# Patient Record
Sex: Male | Born: 1960
Health system: Southern US, Community
[De-identification: ages and names within clinical notes are randomized; demographics above are authoritative.]

## PROBLEM LIST (undated history)

## (undated) DIAGNOSIS — F32A Depression, unspecified: Secondary | ICD-10-CM

## (undated) DIAGNOSIS — F329 Major depressive disorder, single episode, unspecified: Secondary | ICD-10-CM

## (undated) DIAGNOSIS — F419 Anxiety disorder, unspecified: Secondary | ICD-10-CM

## (undated) HISTORY — DX: Anxiety disorder, unspecified: F41.9

## (undated) HISTORY — DX: Major depressive disorder, single episode, unspecified: F32.9

## (undated) HISTORY — PX: FOOT SURGERY: SHX648

## (undated) HISTORY — DX: Depression, unspecified: F32.A

---

## 2004-08-07 ENCOUNTER — Ambulatory Visit: Payer: Self-pay | Admitting: Internal Medicine

## 2005-04-10 ENCOUNTER — Emergency Department: Payer: Self-pay | Admitting: Emergency Medicine

## 2005-04-18 ENCOUNTER — Ambulatory Visit: Payer: Self-pay

## 2005-04-28 ENCOUNTER — Ambulatory Visit: Payer: Self-pay | Admitting: Internal Medicine

## 2006-08-08 IMAGING — CT CT CHEST W/ CM
1 series · 15 of 33 positions shown, 19 images · non-contrast
Comparison: none

REASON FOR EXAM: bronchitis and cough
COMMENTS:

[Series 2: soft tissue · axial · 0.72mm/px · z∈[-680,-406]mm · 15 of 65 slices shown, 19 images]
[im 5/65  mediastinal]
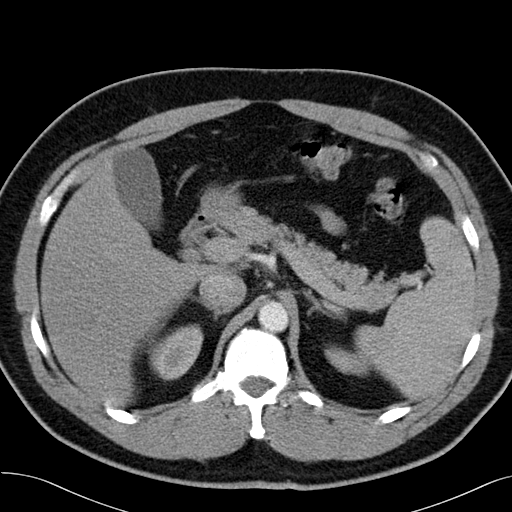
[im 5/65  lung]
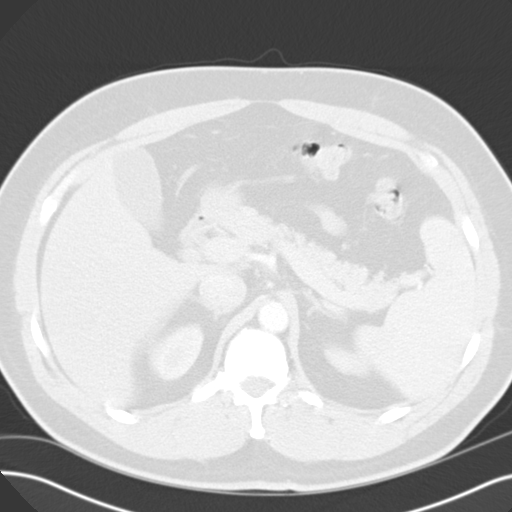
[im 10/65  lung]
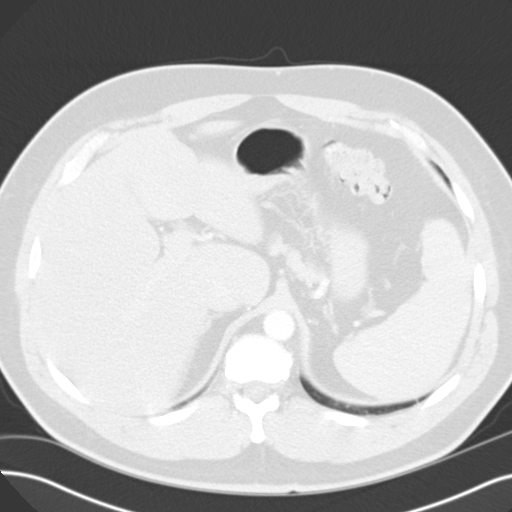
[im 13/65  lung]
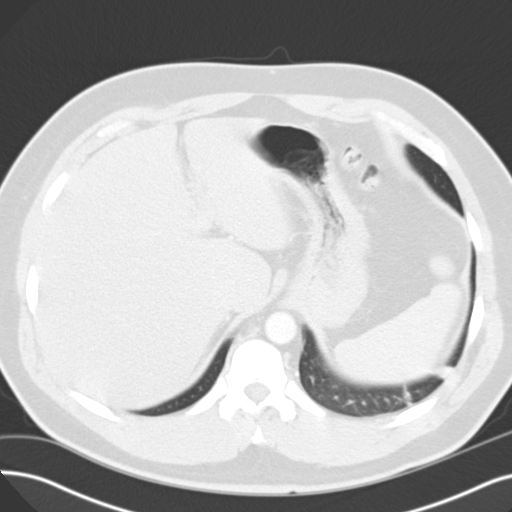
[im 17/65  lung]
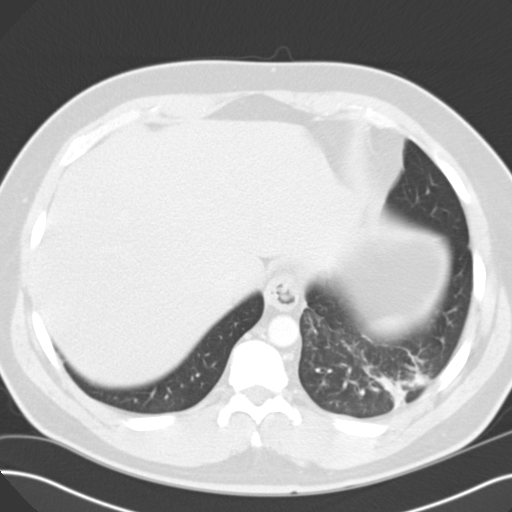
[im 22/65  mediastinal]
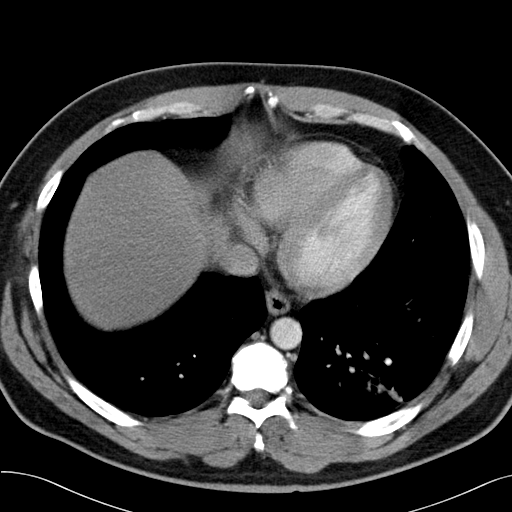
[im 22/65  lung]
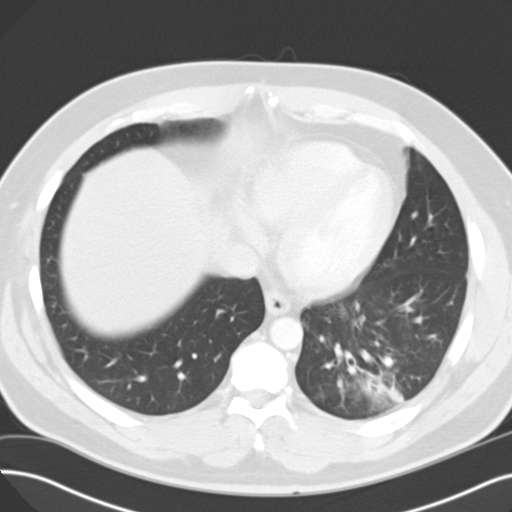
[im 26/65  lung]
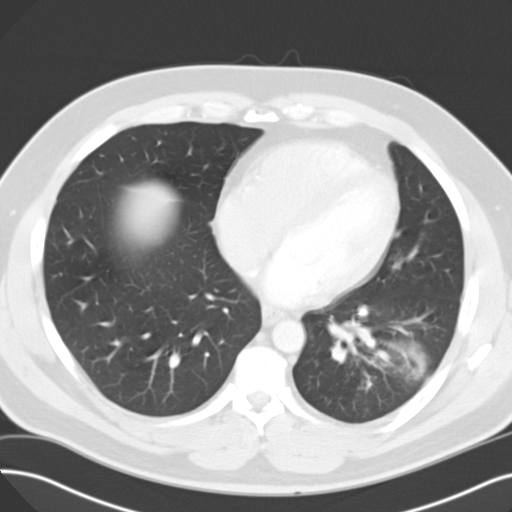
[im 29/65  lung]
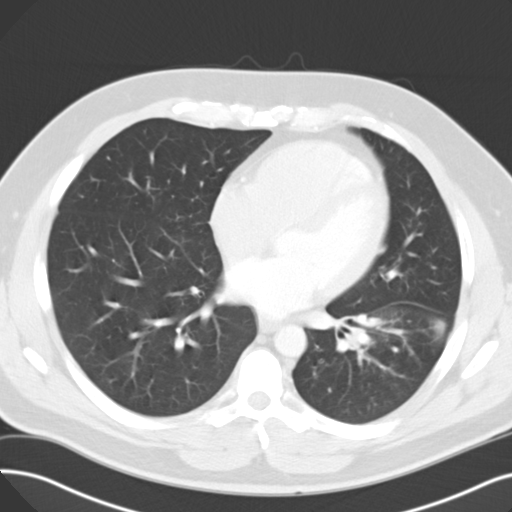
[im 34/65  lung]
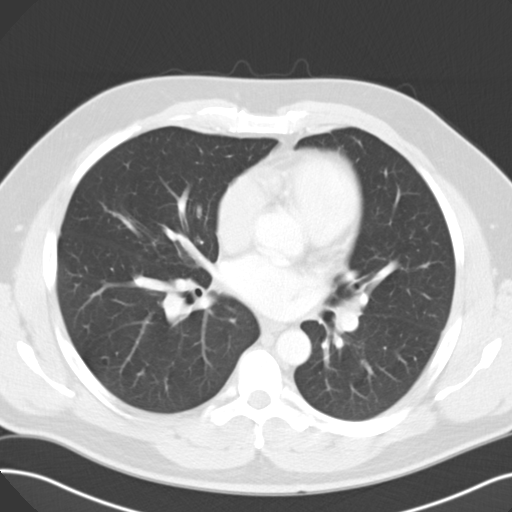
[im 36/65  mediastinal]
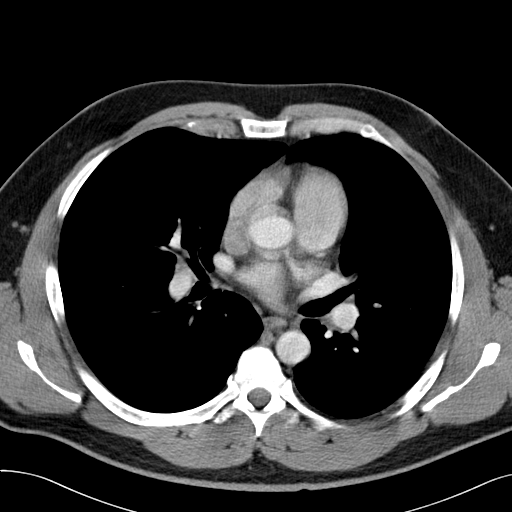
[im 36/65  lung]
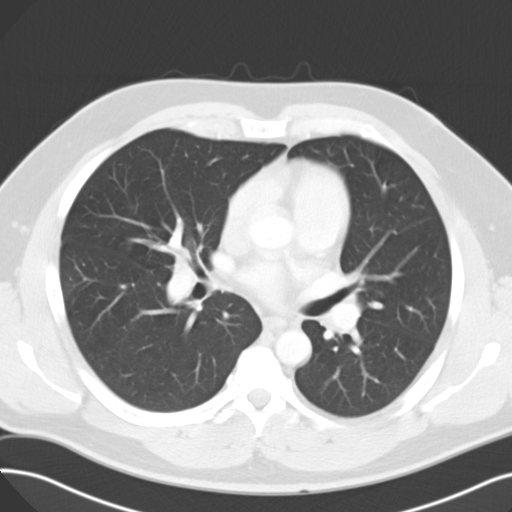
[im 39/65  lung]
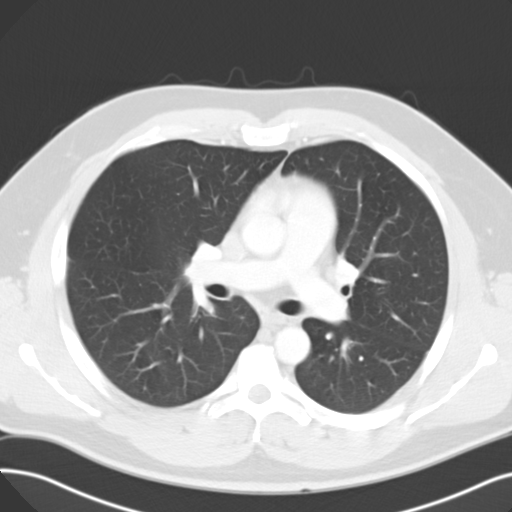
[im 43/65  lung]
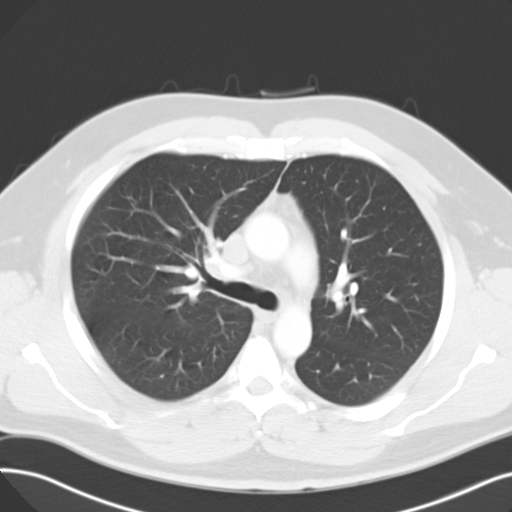
[im 48/65  lung]
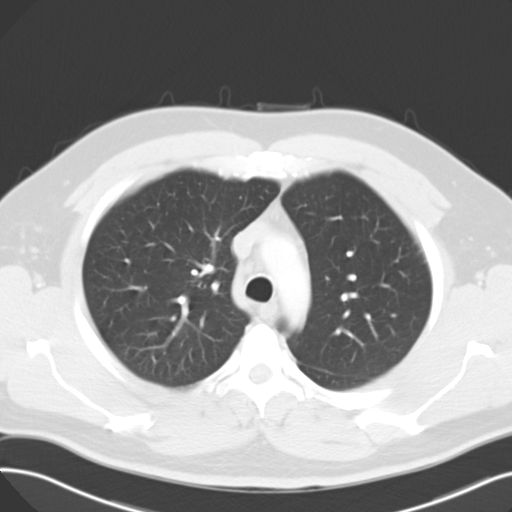
[im 52/65  mediastinal]
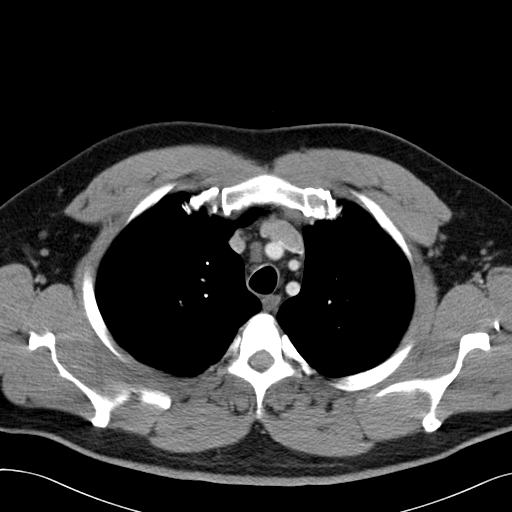
[im 52/65  lung]
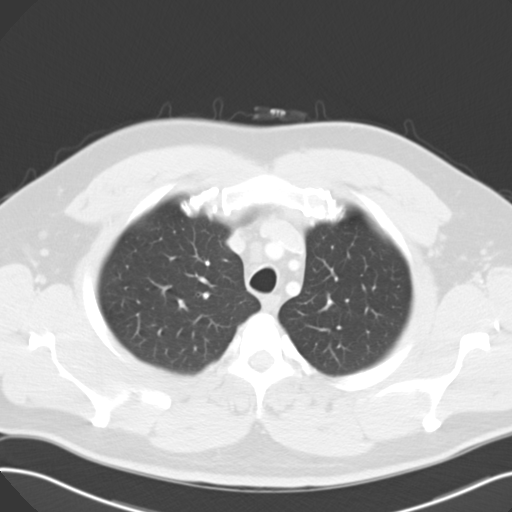
[im 55/65  lung]
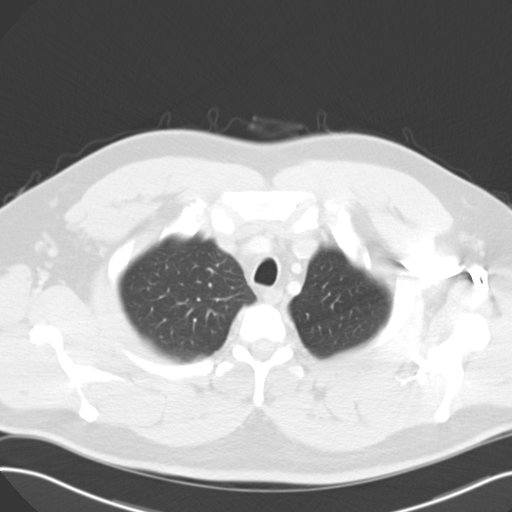
[im 60/65  lung]
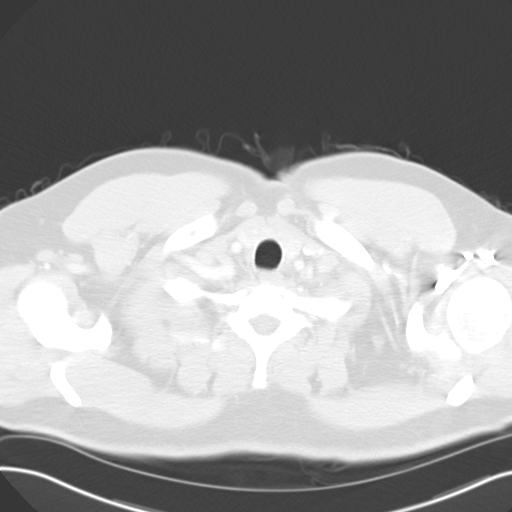

[15 of 33 positions shown; findings below may reference images not displayed]

PROCEDURE:     CT  - CT CHEST WITH CONTRAST  - April 18, 2005  [DATE]

RESULT:     The patient has clinical findings of bronchitis with cough.  The
patient received 75 ccs of Isovue 370 for this study.

Within the mediastinum and hilar regions I see no pathologic sized lymph
nodes.  The caliber of the thoracic aorta is normal. The cardiac chambers
are not enlarged. There is no pleural nor pericardial effusion identified.
There is a small hiatal hernia. At lung window settings the upper lobes and
RIGHT middle lobe are clear. In the LEFT lower lobe there is patchy density
extending from the pleural surface centrally consistent with atelectasis.  I
see no discrete air bronchogram.  The RIGHT lower lobe is clear.  Neither
lung demonstrates abnormal masses. Within the upper abdomen the observed
portions of the liver are normal. The adrenal gland and partially imaged
spleen appeared normal.
IMPRESSION: 1)There are findings consistent with atelectasis or infiltrate in the LEFT
lower lobe. I see no infiltrates elsewhere and no evidence of central
obstructing lesions.

2)I see no evidence of CHF nor other acute cardiopulmonary disease.

## 2007-02-01 ENCOUNTER — Emergency Department (HOSPITAL_COMMUNITY): Admission: EM | Admit: 2007-02-01 | Discharge: 2007-02-01 | Payer: Self-pay | Admitting: Emergency Medicine

## 2007-08-16 ENCOUNTER — Emergency Department (HOSPITAL_COMMUNITY): Admission: EM | Admit: 2007-08-16 | Discharge: 2007-08-16 | Payer: Self-pay | Admitting: Emergency Medicine

## 2008-10-10 ENCOUNTER — Emergency Department (HOSPITAL_COMMUNITY): Admission: EM | Admit: 2008-10-10 | Discharge: 2008-10-10 | Payer: Self-pay | Admitting: Emergency Medicine

## 2008-10-12 ENCOUNTER — Emergency Department (HOSPITAL_COMMUNITY): Admission: EM | Admit: 2008-10-12 | Discharge: 2008-10-12 | Payer: Self-pay | Admitting: Family Medicine

## 2010-11-23 NOTE — Consult Note (Signed)
[name], [name]                 ACCOUNT NO.:  [id number]   MEDICAL RECORD NO.:  [id number]          PATIENT TYPE:  EMS   LOCATION:  [facility]                         FACILITY:  [facility]   PHYSICIAN:  Corinna L. Lendell Caprice, MDDATE OF BIRTH:  1961-07-03   DATE OF CONSULTATION:  08/16/2007  DATE OF DISCHARGE:                                 CONSULTATION   CONSULTING PHYSICIAN:  Donnetta Hutching, M.D.   REASON FOR CONSULTATION:  Chest pain.   IMPRESSION/RECOMMENDATIONS:  Chest pain.  This is very atypical.  His  point-of-care enzymes, EKG, chest x-ray and D-dimer are all within  normal limits.  The nitroglycerin did not change the pain.  The patient  has no cardiac risk factors and is feeling better.  I do not feel he  needs inpatient workup.  I have, however, discussed the case with Leroy Salazar from [facility], and I will have him follow up with Dr. Donato Salazar as an outpatient to consider stress Cardiolite.  In the meantime,  I have asked him to take an aspirin 81 mg daily.   HISTORY OF PRESENT ILLNESS:  [name] is a pleasant 50 year old white  male patient of Dr. Abigail Salazar who came in with several hours' worth of  chest discomfort.  He describes it as bandlike across his upper abdomen  and lower chest.  He thought initially it was indigestion.  He has never  had this sensation before.  It was at rest.  He has never had a cardiac  workup.  He has no chronic medical problems.  The pain is gone now.  He  had no accompanying symptoms.  No exacerbating factors.   PAST MEDICAL HISTORY:  None.   MEDICATIONS:  None.   ALLERGIES:  None.   SOCIAL HISTORY:  He drinks occasionally.  He does not smoke.  No drugs.  He is a Insurance underwriter.   FAMILY HISTORY:  Negative for early coronary artery disease.   REVIEW OF SYSTEMS:  As above, otherwise negative.   PHYSICAL EXAMINATION:  VITAL SIGNS:  He has normal vital signs.  Please  see nursing notes for details.  GENERAL:  He is well-nourished,  well-developed, in no acute distress.  HEENT:  Normocephalic, atraumatic.  Pupils are equal, round and reactive  to light.  Sclerae are anicteric.  Moist mucous membranes.  NECK:  Supple.  No lymphadenopathy.  No carotid bruits.  No JVD.  LUNGS:  Clear to auscultation bilaterally without wheezes, rhonchi or  rales.  CARDIOVASCULAR:  Regular rate and rhythm without murmurs, gallops or  rubs.  No chest wall tenderness.  ABDOMEN:  Soft, nontender, nondistended.  GENITOURINARY/RECTAL:  Deferred.  EXTREMITIES:  No clubbing, cyanosis or edema.  SKIN:  No rash.  PSYCHIATRIC:  Normal affect.   LABORATORY DATA:  Hemoglobin is 7.3, hematocrit 51, D-dimer less than  0.22.  Basic metabolic panel normal.  Point-of-care enzymes normal.  EKG  shows sinus bradycardia with a rate of 58.  Chest x-ray negative.      Corinna L. Lendell Caprice, MD  Electronically Signed     CLS/MEDQ  D:  08/16/2007  T:  08/17/2007  Job:  [id number]   cc:   Leroy Salazar. Leroy Salazar, M.D.  Leroy Bathe, MD

## 2011-04-01 LAB — I-STAT 8, (EC8 V) (CONVERTED LAB)
Acid-base deficit: 1
Bicarbonate: 24.9 — ABNORMAL HIGH
HCT: 51
Hemoglobin: 17.3 — ABNORMAL HIGH
Operator id: 294501
Potassium: 4
Sodium: 139
TCO2: 26

## 2011-04-01 LAB — POCT CARDIAC MARKERS
CKMB, poc: <1 — ABNORMAL LOW
Operator id: 294501
Troponin i, poc: <0.05

## 2011-04-01 LAB — POCT I-STAT CREATININE
Creatinine, Ser: 1
Operator id: 294501

## 2011-10-21 ENCOUNTER — Encounter: Payer: Self-pay | Admitting: Family Medicine

## 2011-10-21 ENCOUNTER — Ambulatory Visit (INDEPENDENT_AMBULATORY_CARE_PROVIDER_SITE_OTHER): Payer: BC Managed Care – PPO | Admitting: Family Medicine

## 2011-10-21 VITALS — BP 127/84 | HR 65 | Temp 98.3°F | Ht 75.0 in | Wt 260.0 lb

## 2011-10-21 DIAGNOSIS — M7660 Achilles tendinitis, unspecified leg: Secondary | ICD-10-CM

## 2011-10-21 DIAGNOSIS — S8990XA Unspecified injury of unspecified lower leg, initial encounter: Secondary | ICD-10-CM

## 2011-10-21 DIAGNOSIS — S99921A Unspecified injury of right foot, initial encounter: Secondary | ICD-10-CM

## 2011-10-21 NOTE — Patient Instructions (Signed)
You partially tore/strained your right plantar fascia. You also have right achilles tendinopathy. Take tylenol and/or aleve as needed for pain  Plantar fascia stretch for 20-30 seconds (do 3 of these) in morning Lowering/raise on a step exercises 3 x 10 once or twice a day - this is very important for long term recovery - may want to way 5-7 days to start this on your right side. Some people will opt for a walking boot with heel lifts - this is a consideration but most people improve faster without it. Can add heel walks, toe walks forward and backward as well Ice heel for 15 minutes as needed. Avoid flat shoes/barefoot walking as much as possible. Arch straps have been shown to help with pain. Heel lifts may help with pain by avoiding fully stretching the plantar fascia except when doing home exercises. Orthotics with heel lift may be helpful. Steroid injection is a consideration for short term pain relief if you are struggling. Physical therapy is also an option. Can consider nitro patches for left achilles tendon if not improving. Follow up with me in 1 month for a recheck.

## 2011-10-25 ENCOUNTER — Encounter: Payer: Self-pay | Admitting: Family Medicine

## 2011-10-25 DIAGNOSIS — S99921A Unspecified injury of right foot, initial encounter: Secondary | ICD-10-CM | POA: Insufficient documentation

## 2011-10-25 DIAGNOSIS — M7660 Achilles tendinitis, unspecified leg: Secondary | ICD-10-CM | POA: Insufficient documentation

## 2011-10-25 NOTE — Progress Notes (Signed)
Subjective:    Patient ID: Leroy Salazar, male    DOB: 1961-01-31, 51 y.o.   MRN: [id number]  PCP: [facility] [employer]  HPI 51 yo M here for bilateral heel pain.  Patient reports having had prior issues with right sided plantar fasciitis - previously had injections x 2 to right heel. States was playing basketball on 4/11 - felt at time that he had a stone in his shoe on plantar surface (had this for about 3 weeks). Then while running felt a pop on plantar surface of heel that was painful and caused him to stop playing. Iced area last night, taking ibuprofen. Has been resting and doing basic stretches was shown previously for plantar fascia. Limping as a result of this injury. Had prior surgeries of right ankle to repair peroneal subluxation and an osteotomy medially based on his reports. Has also tried orthotics previously. Notes having achilles pain on left side as well.  History reviewed. No pertinent past medical history.  No current outpatient prescriptions on file prior to visit.    Past Surgical History  Procedure Date  . Foot surgery     Allergies  Allergen Reactions  . Codeine     History   Social History  . Marital Status: Divorced    Spouse Name: N/A    Number of Children: N/A  . Years of Education: N/A   Occupational History  . Not on file.   Social History Main Topics  . Smoking status: Never Smoker   . Smokeless tobacco: Not on file  . Alcohol Use: Not on file  . Drug Use: Not on file  . Sexually Active: Not on file   Other Topics Concern  . Not on file   Social History Narrative  . No narrative on file    Family History  Problem Relation Age of Onset  . Sudden death Neg Hx   . Hyperlipidemia Neg Hx   . Heart attack Neg Hx   . Diabetes Neg Hx   . Hypertension Neg Hx     BP 127/84  Pulse 65  Temp(Src) 98.3 F (36.8 C) (Oral)  Ht 6\' 3"  (1.905 m)  Wt 260 lb (117.935 kg)  BMI 32.50 kg/m2  Review of Systems See HPI above.      Objective:   Physical Exam Gen: NAD  R foot/ankle: No gross deformity, swelling, ecchymoses FROM ankle with pain in prox plantar fascia with dorsiflexion. TTP proximal plantar fascia especially at medial anterior portion of calcaneus. Negative ant drawer and talar tilt.   Negative syndesmotic compression. Thompsons test negative. NV intact distally.  L foot/ankle: No gross deformity, swelling, ecchymoses. FROM TTP 2 cm proximal to achilles insertion on calcaneus. Negative ant drawer and talar tilt.   Negative syndesmotic compression. Thompsons test negative. NV intact distally.  MSK u/s: increased fluid within proximal left plantar fascia - measures about 0.78cm at maximal thickness.    Assessment & Plan:  1. Right foot injury - due to plantar fascia strain/partial rupture.  Reassured patient.  Advised to wait about 5-7 days then start HEP including eccentric exercises.  Tylenol/aleve as needed for pain.  Considered arch strap but not much benefit felt with this in the office.  Heel lifts to help unload area.  Avoid flat shoes, barefoot walking, hills as much as possible.  Consider steroid injection (usually avoid this with strain/partial tear though), orthotics, PT if not improving as expected.  2. Left achilles tendinopathy - Similar exercises demonstrated, heel lifts,  icing, tylenol/nsaids as needed.  Consider orthotics, formal PT, nitro patches if not improving as expected.

## 2011-10-25 NOTE — Assessment & Plan Note (Signed)
Similar exercises demonstrated, heel lifts, icing, tylenol/nsaids as needed.  Consider orthotics, formal PT, nitro patches if not improving as expected.

## 2011-10-25 NOTE — Assessment & Plan Note (Signed)
due to plantar fascia strain/partial rupture.  Reassured patient.  Advised to wait about 5-7 days then start HEP including eccentric exercises.  Tylenol/aleve as needed for pain.  Considered arch strap but not much benefit felt with this in the office.  Heel lifts to help unload area.  Avoid flat shoes, barefoot walking, hills as much as possible.  Consider steroid injection (usually avoid this with strain/partial tear though), orthotics, PT if not improving as expected.

## 2013-04-12 LAB — LIPID PANEL
CHOLESTEROL: 169 mg/dL (ref 0–200)
HDL: 43 mg/dL (ref 35–70)
LDL CALC: 104 mg/dL
TRIGLYCERIDES: 111 mg/dL (ref 40–160)

## 2013-04-12 LAB — BASIC METABOLIC PANEL
BUN: 17 mg/dL (ref 4–21)
Creatinine: 0.9 mg/dL (ref <=1.3)
Glucose: 83 mg/dL
Potassium: 4.4 mmol/L (ref 3.4–5.3)
Sodium: 138 mmol/L (ref 137–147)

## 2013-04-12 LAB — HEPATIC FUNCTION PANEL
ALK PHOS: 39 U/L (ref 25–125)
ALT: 32 U/L (ref 10–40)
AST: 27 U/L (ref 14–40)
Bilirubin, Total: 0.7 mg/dL

## 2013-04-12 LAB — CBC AND DIFFERENTIAL
HCT: 48 % (ref 41–53)
HEMOGLOBIN: 16.6 g/dL (ref 13.5–17.5)
PLATELETS: 218 10*3/uL (ref 150–399)
WBC: 6.6 10^3/mL

## 2013-04-12 LAB — CHLORIDE: Chloride: 107 mmol/L

## 2013-04-12 LAB — ESTIMATED GFR: EGFR (Non-African Amer.): 94

## 2013-04-12 LAB — PSA: PSA, Total: 0.78

## 2013-05-28 LAB — HM COLONOSCOPY

## 2013-05-29 ENCOUNTER — Ambulatory Visit: Payer: BC Managed Care – PPO | Admitting: Family Medicine

## 2013-06-12 ENCOUNTER — Ambulatory Visit: Payer: BC Managed Care – PPO | Admitting: Family Medicine

## 2016-02-12 ENCOUNTER — Encounter: Payer: Self-pay | Admitting: Family Medicine

## 2016-02-12 ENCOUNTER — Ambulatory Visit (INDEPENDENT_AMBULATORY_CARE_PROVIDER_SITE_OTHER): Payer: BLUE CROSS/BLUE SHIELD | Admitting: Family Medicine

## 2016-02-12 VITALS — BP 129/84 | HR 56 | Ht 73.5 in | Wt 271.8 lb

## 2016-02-12 DIAGNOSIS — R030 Elevated blood-pressure reading, without diagnosis of hypertension: Secondary | ICD-10-CM | POA: Insufficient documentation

## 2016-02-12 DIAGNOSIS — E669 Obesity, unspecified: Secondary | ICD-10-CM

## 2016-02-12 DIAGNOSIS — F430 Acute stress reaction: Secondary | ICD-10-CM | POA: Diagnosis not present

## 2016-02-12 DIAGNOSIS — F4323 Adjustment disorder with mixed anxiety and depressed mood: Secondary | ICD-10-CM | POA: Diagnosis not present

## 2016-02-12 DIAGNOSIS — IMO0001 Reserved for inherently not codable concepts without codable children: Secondary | ICD-10-CM

## 2016-02-12 MED ORDER — FLUOXETINE HCL 20 MG PO TABS: 20.0000 mg | ORAL_TABLET | Freq: Every day | ORAL | 0 refills | Status: DC

## 2016-02-12 NOTE — Assessment & Plan Note (Signed)
No suicidal or homicidal ideations. Says he would never do that to his kids

## 2016-02-12 NOTE — Assessment & Plan Note (Signed)
Risk benefits fluoxetine discussed with patient.  Dietary and lifestyle modifications for better management of stress discussed- exercise etc  Sleep hygiene

## 2016-02-12 NOTE — Patient Instructions (Signed)
Hypertension Hypertension, commonly called high blood pressure, is when the force of blood pumping through your arteries is too strong. Your arteries are the blood vessels that carry blood from your heart throughout your body. A blood pressure reading consists of a higher number over a lower number, such as 110/72. The higher number (systolic) is the pressure inside your arteries when your heart pumps. The lower number (diastolic) is the pressure inside your arteries when your heart relaxes. Ideally you want your blood pressure below 120/80. Hypertension forces your heart to work harder to pump blood. Your arteries may become narrow or stiff. Having untreated or uncontrolled hypertension can cause heart attack, stroke, kidney disease, and other problems. RISK FACTORS Some risk factors for high blood pressure are controllable. Others are not.  Risk factors you cannot control include:   Race. You may be at higher risk if you are African American.  Age. Risk increases with age.  Gender. Men are at higher risk than women before age 61 years. After age 68, women are at higher risk than men. Risk factors you can control include:  Not getting enough exercise or physical activity.  Being overweight.  Getting too much fat, sugar, calories, or salt in your diet.  Drinking too much alcohol. SIGNS AND SYMPTOMS Hypertension does not usually cause signs or symptoms. Extremely high blood pressure (hypertensive crisis) may cause headache, anxiety, shortness of breath, and nosebleed. DIAGNOSIS To check if you have hypertension, your health care provider will measure your blood pressure while you are seated, with your arm held at the level of your heart. It should be measured at least twice using the same arm. Certain conditions can cause a difference in blood pressure between your right and left arms. A blood pressure reading that is higher than normal on one occasion does not mean that you need  treatment. If it is not clear whether you have high blood pressure, you may be asked to return on a different day to have your blood pressure checked again. Or, you may be asked to monitor your blood pressure at home for 1 or more weeks. TREATMENT Treating high blood pressure includes making lifestyle changes and possibly taking medicine. Living a healthy lifestyle can help lower high blood pressure. You may need to change some of your habits. Lifestyle changes may include:  Following the DASH diet. This diet is high in fruits, vegetables, and whole grains. It is low in salt, red meat, and added sugars.  Keep your sodium intake below 2,300 mg per day.  Getting at least 30-45 minutes of aerobic exercise at least 4 times per week.  Losing weight if necessary.  Not smoking.  Limiting alcoholic beverages.  Learning ways to reduce stress. Your health care provider may prescribe medicine if lifestyle changes are not enough to get your blood pressure under control, and if one of the following is true:  You are 75-55 years of age and your systolic blood pressure is above 140.  You are 28 years of age or older, and your systolic blood pressure is above 150.  Your diastolic blood pressure is above 90.  You have diabetes, and your systolic blood pressure is over [id number] or your diastolic blood pressure is over 90.  You have kidney disease and your blood pressure is above 140/90.  You have heart disease and your blood pressure is above 140/90. Your personal target blood pressure may vary depending on your medical conditions, your age, and other factors. HOME CARE  INSTRUCTIONS  Have your blood pressure rechecked as directed by your health care provider.   Take medicines only as directed by your health care provider. Follow the directions carefully. Blood pressure medicines must be taken as prescribed. The medicine does not work as well when you skip doses. Skipping doses also puts you at risk for  problems.  Do not smoke.   Monitor your blood pressure at home as directed by your health care provider. SEEK MEDICAL CARE IF:   You think you are having a reaction to medicines taken.  You have recurrent headaches or feel dizzy.  You have swelling in your ankles.  You have trouble with your vision. SEEK IMMEDIATE MEDICAL CARE IF:  You develop a severe headache or confusion.  You have unusual weakness, numbness, or feel faint.  You have severe chest or abdominal pain.  You vomit repeatedly.  You have trouble breathing. MAKE SURE YOU:   Understand these instructions.  Will watch your condition.  Will get help right away if you are not doing well or get worse.   This information is not intended to replace advice given to you by your health care provider. Make sure you discuss any questions you have with your health care provider.   Document Released: 06/27/2005 Document Revised: 11/11/2014 Document Reviewed: 04/19/2013 Elsevier Interactive Patient Education 2016 Elsevier Inc.      DASH Eating Plan DASH stands for "Dietary Approaches to Stop Hypertension." The DASH eating plan is a healthy eating plan that has been shown to reduce high blood pressure (hypertension). Additional health benefits may include reducing the risk of type 2 diabetes mellitus, heart disease, and stroke. The DASH eating plan may also help with weight loss. WHAT DO I NEED TO KNOW ABOUT THE DASH EATING PLAN? For the DASH eating plan, you will follow these general guidelines:  Choose foods with a percent daily value for sodium of less than 5% (as listed on the food label).  Use salt-free seasonings or herbs instead of table salt or sea salt.  Check with your health care provider or pharmacist before using salt substitutes.  Eat lower-sodium products, often labeled as "lower sodium" or "no salt added."  Eat fresh foods.  Eat more vegetables, fruits, and low-fat dairy products.  Choose  whole grains. Look for the word "whole" as the first word in the ingredient list.  Choose fish and skinless chicken or [place] more often than red meat. Limit fish, poultry, and meat to 6 oz (170 g) each day.  Limit sweets, desserts, sugars, and sugary drinks.  Choose heart-healthy fats.  Limit cheese to 1 oz (28 g) per day.  Eat more home-cooked food and less restaurant, buffet, and fast food.  Limit fried foods.  Cook foods using methods other than frying.  Limit canned vegetables. If you do use them, rinse them well to decrease the sodium.  When eating at a restaurant, ask that your food be prepared with less salt, or no salt if possible. WHAT FOODS CAN I EAT? Seek help from a dietitian for individual calorie needs. Grains Whole grain or whole wheat bread. Brown rice. Whole grain or whole wheat pasta. Quinoa, bulgur, and whole grain cereals. Low-sodium cereals. Corn or whole wheat flour tortillas. Whole grain cornbread. Whole grain crackers. Low-sodium crackers. Vegetables Fresh or frozen vegetables (raw, steamed, roasted, or grilled). Low-sodium or reduced-sodium tomato and vegetable juices. Low-sodium or reduced-sodium tomato sauce and paste. Low-sodium or reduced-sodium canned vegetables.  Fruits All fresh, canned (in natural  juice), or frozen fruits. Meat and Other Protein Products Ground beef (85% or leaner), grass-fed beef, or beef trimmed of fat. Skinless chicken or [place]. Ground chicken or [place]. Pork trimmed of fat. All fish and seafood. Eggs. Dried beans, peas, or lentils. Unsalted nuts and seeds. Unsalted canned beans. Dairy Low-fat dairy products, such as skim or 1% milk, 2% or reduced-fat cheeses, low-fat ricotta or cottage cheese, or plain low-fat yogurt. Low-sodium or reduced-sodium cheeses. Fats and Oils Tub margarines without trans fats. Light or reduced-fat mayonnaise and salad dressings (reduced sodium). Avocado. Safflower, olive, or canola oils. Natural peanut  or almond butter. Other Unsalted popcorn and pretzels. The items listed above may not be a complete list of recommended foods or beverages. Contact your dietitian for more options. WHAT FOODS ARE NOT RECOMMENDED? Grains White bread. White pasta. White rice. Refined cornbread. Bagels and croissants. Crackers that contain trans fat. Vegetables Creamed or fried vegetables. Vegetables in a cheese sauce. Regular canned vegetables. Regular canned tomato sauce and paste. Regular tomato and vegetable juices. Fruits Dried fruits. Canned fruit in light or heavy syrup. Fruit juice. Meat and Other Protein Products Fatty cuts of meat. Ribs, chicken wings, bacon, sausage, bologna, salami, chitterlings, fatback, hot dogs, bratwurst, and packaged luncheon meats. Salted nuts and seeds. Canned beans with salt. Dairy Whole or 2% milk, cream, half-and-half, and cream cheese. Whole-fat or sweetened yogurt. Full-fat cheeses or blue cheese. Nondairy creamers and whipped toppings. Processed cheese, cheese spreads, or cheese curds. Condiments Onion and garlic salt, seasoned salt, table salt, and sea salt. Canned and packaged gravies. Worcestershire sauce. Tartar sauce. Barbecue sauce. Teriyaki sauce. Soy sauce, including reduced sodium. Steak sauce. Fish sauce. Oyster sauce. Cocktail sauce. Horseradish. Ketchup and mustard. Meat flavorings and tenderizers. Bouillon cubes. Hot sauce. Tabasco sauce. Marinades. Taco seasonings. Relishes. Fats and Oils Butter, stick margarine, lard, shortening, ghee, and bacon fat. Coconut, palm kernel, or palm oils. Regular salad dressings. Other Pickles and olives. Salted popcorn and pretzels. The items listed above may not be a complete list of foods and beverages to avoid. Contact your dietitian for more information. WHERE CAN I FIND MORE INFORMATION? National Heart, Lung, and Blood Institute: [employer]Prom[employer].it   This information is not intended to  replace advice given to you by your health care provider. Make sure you discuss any questions you have with your health care provider.   Document Released: 06/16/2011 Document Revised: 07/18/2014 Document Reviewed: 05/01/2013 Elsevier Interactive Patient Education 2016 [employer].     Guidelines for a Low Sodium Diet   Low Sodium Diet A main source of sodium is table salt. The average American eats five or more teaspoons of salt each day. This is about 20 times as much as the body needs. In fact, your body needs only 1/4 teaspoon of salt every day. Sodium is found naturally in foods, but a lot of it is added during processing and preparation. Many foods that do not taste salty may still be high in sodium. Large amounts of sodium can be hidden in canned, processed and convenience foods. And sodium can be found in many foods that are served at [employer].  Sodium controls fluid balance in our bodies and maintains blood volume and blood pressure. Eating too much sodium may raise blood pressure and cause fluid retention, which could lead to swelling of the legs and feet or other health issues.  When limiting sodium in your diet, a common target is to eat less than 2,000 milligrams of sodium per  day.   General Guidelines for Cutting Down on Salt Eliminate salty foods from your diet and reduce the amount of salt used in cooking. Sea salt is no better than regular salt.  Choose low sodium foods. Many salt-free or reduced salt products are available. When reading food labels, low sodium is defined as 140 mg of sodium per serving.  Salt substitutes are sometimes made from potassium, so read the label. If you are on a low potassium diet, then check with your doctor before using those salt substitutes.  Be creative and season your foods with spices, herbs, lemon, garlic, ginger, vinegar and pepper. Remove the salt shaker from the table.  Read ingredient labels to identify foods high in  sodium. Items with 400 mg or more of sodium are high in sodium. High sodium food additives include salt, brine, or other items that say sodium, such as monosodium glutamate.  Eat more home-cooked meals. Foods cooked from scratch are naturally lower in sodium than most instant and boxed mixes.  Don't use softened water for cooking and drinking since it contains added salt.  Avoid medications which contain sodium such as [place] Tour manager.  For more information; food composition books are available which tell how much sodium is in food. Online sources such as www.calorieking.com also list amounts.     Meats, Poultry, Fish, Legumes, Eggs and Nuts  High-Sodium Foods: Smoked, cured, salted or canned meat, fish or poultry including bacon, cold cuts, ham, frankfurters, sausage, sardines, caviar and anchovies Frozen breaded meats and dinners, such as burritos and pizza Canned entrees, such as ravioli, spam and chili Salted nuts Beans canned with salt added  Low-Sodium Alternatives: Any fresh or frozen beef, lamb, pork, poultry and fish Eggs and egg substitutes Low-sodium peanut butter Dry peas and beans (not canned) Low-sodium canned fish Drained, water or oil packed canned fish or poultry   Dairy Products  High-Sodium Foods: Buttermilk Regular and processed cheese, cheese spreads and sauces Cottage cheese  Low-Sodium Alternatives: Milk, yogurt, ice cream and ice milk Low-sodium cheeses, cream cheese, ricotta cheese and mozzarella   Breads, Grains and Cereals  High-Sodium Foods: Bread and rolls with salted tops Quick breads, self-rising flour, biscuit, pancake and waffle mixes Pizza, croutons and salted crackers Prepackaged, processed mixes for potatoes, rice, pasta and stuffing  Low-Sodium Alternatives: Breads, bagels and rolls without salted tops Muffins and most ready-to-eat cereals All rice and pasta, but do not to add salt when cooking Low-sodium corn  and flour tortillas and noodles Low-sodium crackers and breadsticks Unsalted popcorn, chips and pretzels      Vegetables and Fruits  High-Sodium Foods: Regular canned vegetables and vegetable juices Olives, pickles, sauerkraut and other pickled vegetables Vegetables made with ham, bacon or salted pork Packaged mixes, such as scalloped or au gratin potatoes, frozen hash browns and Tater Tots Commercially prepared pasta and tomato sauces and salsa  Low-Sodium Alternatives: Fresh and frozen vegetables without sauces Low-sodium canned vegetables, sauces and juices Fresh potatoes, frozen [place] fries and instant mashed potatoes Low-salt tomato or V-8 juice. Most fresh, frozen and canned fruit Dried fruits   Soups  High-Sodium Foods: Regular canned and dehydrated soup, broth and bouillon Cup of noodles and seasoned ramen mixes  Low-Sodium Alternatives: Low-sodium canned and dehydrated soups, broth and bouillon Homemade soups without added salt   Fats, Desserts and Sweets  High-Sodium Foods: Soy sauce, seasoning salt, other sauces and marinades Bottled salad dressings, regular salad dressing with bacon bits Salted butter or margarine  Instant pudding and cake Large portions of ketchup, mustard  Low-Sodium Alternatives: Vinegar, unsalted butter or margarine Vegetable oils and low sodium sauces and salad dressings Mayonnaise All desserts made without salt

## 2016-02-12 NOTE — Progress Notes (Signed)
New patient office visit note:  Impression and Recommendations:    1. Acute gross stress reaction   2. Adjustment disorder with mixed anxiety and depressed mood   3. Elevated blood pressure   4. Obesity     Adjustment disorder with mixed anxiety and depressed mood Risk benefits fluoxetine discussed with patient.  Dietary and lifestyle modifications for better management of stress discussed- exercise etc  Sleep hygiene   Acute gross stress reaction No suicidal or homicidal ideations. Says he would never do that to his kids  Elevated blood pressure Patient's blood pressure initially was elevated at 154/103 and then came down upon recheck to 129/84.    Very likely could be stress reaction but asked patient to monitor at home and bring in a log of his blood pressures at follow-up in 3 weeks.   Obesity Advised patient to try to eat less junk food and just healthier options at this time.  Pt was in the office today for 40+ minutes, with over 50% time spent in face to face counseling of various medical concerns and in coordination of care  Patient's Medications  New Prescriptions   FLUOXETINE (PROZAC) 20 MG TABLET    Take 1 tablet (20 mg total) by mouth daily.  Previous Medications   No medications on file  Modified Medications   No medications on file  Discontinued Medications   No medications on file    Return in about 3 weeks (around 03/04/2016) for near future come in for fasting bld work; Follow-up of current medical issues.  The patient was counseled, risk factors were discussed, anticipatory guidance given.  Gross side effects, risk and benefits, and alternatives of medications discussed with patient.  Patient is aware that all medications have potential side effects and we are unable to predict every side effect or drug-drug interaction that may occur.  Expresses verbal understanding and consents to current therapy plan and treatment regimen.  Please see  AVS handed out to patient at the end of our visit for further patient instructions/ counseling done pertaining to today's office visit.    Note: This document was prepared using Dragon voice recognition software and may include unintentional dictation errors.  ----------------------------------------------------------------------------------------------------------------------    Subjective:    Chief Complaint  Patient presents with  . Establish Care  . Anxiety    HPI: Leroy Salazar is a 55 y.o. male, husband one of my current patient's, who presents to [facility] today to review their medical history with me and establish care.     He has had no regular care--->  Went to [place] at [facility].  Male doc- doesn't know name.    Elevated BP: Was told high BP in past.  Did Lifestyle modification got it under good control - about 4-5 yrs ago.  Has not been taking care of himself.  Obese:  Patient knows that his weight is a problem.  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  Patient feels like he has an additional child now in many ways.     Also his Ex-wife is train wreck- which upsets patient for his kids.  She is also asking him for a  lot of his money as well.     Feels like he has been spiralling down since 2011- when his ex-wife walked out on him.    He does a lot of traveling for work- eats out a lot and not real nutritious meals.   Patient is tearful in the office today.    Has tried "stress meds" in the past- paxil in past for anxiety/ stress.  Made him lethargic, no sex drive.  Didn't like it at all.   Sleeping ok.   Family HTN, DM - elderly Gm. No other sign  No tob, only social etoh use, no drugs.       Patient Active Problem List   Diagnosis Date Noted    . Obesity 02/12/2016  . Acute gross stress reaction 02/12/2016  . Elevated blood pressure 02/12/2016  . Adjustment disorder with mixed anxiety and depressed mood 02/12/2016  . Right foot injury 10/25/2011  . Achilles tendinitis 10/25/2011     Past Medical History:  Diagnosis Date  . Anxiety      Past Surgical History:  Procedure Laterality Date  . FOOT SURGERY       Family History  Problem Relation Age of Onset  . Alcohol abuse Paternal Uncle   . Alcohol abuse Paternal Grandfather   . Sudden death Neg Hx   . Hyperlipidemia Neg Hx   . Heart attack Neg Hx   . Diabetes Neg Hx   . Hypertension Neg Hx      History  Drug Use No    History  Alcohol Use  . Yes    Comment: socially    History  Smoking Status  . Never Smoker  Smokeless Tobacco  . Former Neurosurgeon  . Types: Chew    Patient's Medications  New Prescriptions   FLUOXETINE (PROZAC) 20 MG TABLET    Take 1 tablet (20 mg total) by mouth daily.  Previous Medications   No medications on file  Modified Medications   No medications on file  Discontinued Medications   No medications on file    Allergies: Codeine  Review of Systems:   ( Completed via Adult Medical History Intake form today ) General:  Denies fever, chills, appetite changes, unexplained weight loss.  Optho/Auditory:   Denies visual changes, blurred vision/LOV, ringing in ears/ diff hearing Respiratory:   Denies SOB, DOE, cough, wheezing.  Cardiovascular:   Denies chest pain, palpitations, new onset peripheral edema  Gastrointestinal:   Denies nausea, vomiting, diarrhea.  Genitourinary:    Denies dysuria, increased frequency, flank pain.  Endocrine:     Denies hot or cold intolerance, polyuria, polydipsia. Musculoskeletal:  Denies unexplained myalgias, joint swelling, arthralgias, gait problems.  Skin:  Denies rash, suspicious lesions or new/ changes in moles Neurological:    Denies dizziness, syncope, unexplained weakness,  lightheadedness, numbness  Psychiatric/Behavioral:   +mood changes, - suicidal or homicidal ideations,- hallucinations, + overwhelmed, + anxious    Objective:   Blood pressure 129/84, pulse 75, height 6' 1.5" (1.867 m), weight 271 lb 12.8 oz (123.3 kg). Body mass index is 35.37 kg/m.   General: Well Developed, well nourished, and in no acute distress.  Neuro: Alert and oriented x3, extra-ocular muscles intact, sensation grossly intact.  HEENT: Normocephalic, atraumatic, pupils equal round reactive to light, neck supple, no gross masses, no carotid bruits Skin: no gross suspicious lesions or rashes  Cardiac: Regular rate and rhythm, no murmurs rubs or gallops.  Respiratory: Essentially clear to auscultation bilaterally. Not using accessory muscles,  speaking in full sentences.  Abdominal: Soft, not grossly distended Musculoskeletal: Ambulates w/o diff, FROM * 4 ext.  Vasc: less 2 sec cap RF, warm and pink  Psych:  No HI/SI, judgement and insight good.

## 2016-02-12 NOTE — Assessment & Plan Note (Signed)
Advised patient to try to eat less junk food and just healthier options at this time.

## 2016-02-12 NOTE — Assessment & Plan Note (Signed)
Patient's blood pressure initially was elevated at 154/103 and then came down upon recheck to 129/84.    Very likely could be stress reaction but asked patient to monitor at home and bring in a log of his blood pressures at follow-up in 3 weeks.

## 2016-02-23 ENCOUNTER — Telehealth: Payer: Self-pay

## 2016-02-23 NOTE — Telephone Encounter (Signed)
Leroy SoursGreg called and reports the Prozac has not helped with his feelings of hopelessness. He doesn't feel like doing anything. He states he does have moments of panic attacks. He begins to sweat and has increased heart rate. I advised him he may need to talk with a therapist. He is going to call the employee assistance program with his work to see if he could get help through them. Denies suicidal or homicidal ideations.   After a review of patient's concerns with [name] she recommended to have him increase the Prozac to 2 tablets daily.    I returned call to patient and no answer. Left a message for a return call.

## 2016-02-23 NOTE — Telephone Encounter (Signed)
Patient advised to increase medication to tablets daily. He agreed and will follow up in two weeks.

## 2016-02-29 ENCOUNTER — Telehealth: Payer: Self-pay

## 2016-02-29 ENCOUNTER — Emergency Department (HOSPITAL_COMMUNITY)
Admission: EM | Admit: 2016-02-29 | Discharge: 2016-02-29 | Disposition: A | Discharge status: Home / Self Care | Payer: BLUE CROSS/BLUE SHIELD | Attending: Emergency Medicine | Admitting: Emergency Medicine

## 2016-02-29 ENCOUNTER — Encounter (HOSPITAL_COMMUNITY): Payer: Self-pay | Admitting: Emergency Medicine

## 2016-02-29 DIAGNOSIS — R45851 Suicidal ideations: Secondary | ICD-10-CM | POA: Insufficient documentation

## 2016-02-29 DIAGNOSIS — I10 Essential (primary) hypertension: Secondary | ICD-10-CM | POA: Diagnosis not present

## 2016-02-29 DIAGNOSIS — F329 Major depressive disorder, single episode, unspecified: Secondary | ICD-10-CM | POA: Diagnosis not present

## 2016-02-29 DIAGNOSIS — F32A Depression, unspecified: Secondary | ICD-10-CM

## 2016-02-29 LAB — COMPREHENSIVE METABOLIC PANEL
ALK PHOS: 37 U/L — AB (ref 38–126)
ALT: 30 U/L (ref 17–63)
AST: 27 U/L (ref 15–41)
Albumin: 4.8 g/dL (ref 3.5–5.0)
Anion gap: 10 (ref 5–15)
BILIRUBIN TOTAL: 1 mg/dL (ref 0.3–1.2)
BUN: 16 mg/dL (ref 6–20)
CALCIUM: 9.3 mg/dL (ref 8.9–10.3)
CO2: 21 mmol/L — ABNORMAL LOW (ref 22–32)
CREATININE: 0.98 mg/dL (ref 0.61–1.24)
Chloride: 106 mmol/L (ref 101–111)
GFR calc Af Amer: >60 mL/min (ref >60)
Glucose, Bld: 165 mg/dL — ABNORMAL HIGH (ref 65–99)
POTASSIUM: 3.5 mmol/L (ref 3.5–5.1)
Sodium: 137 mmol/L (ref 135–145)
TOTAL PROTEIN: 7.1 g/dL (ref 6.5–8.1)

## 2016-02-29 LAB — CBC
HCT: 48.6 % (ref 39.0–52.0)
Hemoglobin: 17.7 g/dL — ABNORMAL HIGH (ref 13.0–17.0)
MCH: 31.7 pg (ref 26.0–34.0)
MCHC: 36.4 g/dL — AB (ref 30.0–36.0)
MCV: 87.1 fL (ref 78.0–100.0)
PLATELETS: 213 10*3/uL (ref 150–400)
RBC: 5.58 MIL/uL (ref 4.22–5.81)
RDW: 12.1 % (ref 11.5–15.5)
WBC: 7.2 10*3/uL (ref 4.0–10.5)

## 2016-02-29 LAB — ETHANOL

## 2016-02-29 LAB — SALICYLATE LEVEL: Salicylate Lvl: <4 mg/dL (ref 2.8–30.0)

## 2016-02-29 LAB — ACETAMINOPHEN LEVEL: Acetaminophen (Tylenol), Serum: <10 ug/mL — ABNORMAL LOW (ref 10–30)

## 2016-02-29 MED ORDER — TRAZODONE HCL 50 MG PO TABS: 50.0000 mg | ORAL_TABLET | Freq: Every day | ORAL | 0 refills | Status: DC

## 2016-02-29 NOTE — ED Provider Notes (Signed)
[facility]-EMERGENCY DEPT Provider Note   CSN: [id number] Arrival date & time: 02/29/16  1301     History   Chief Complaint Chief Complaint  Patient presents with  . Suicidal    HPI Leroy Salazar is a 55 y.o. male.  55 year old male who presents with anxiety and depression. 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 Recently, he has had a hard time coping with the stress and 2 weeks ago his PCP started him on Prozac. He reports poor sleep, waking up and having difficulty with racing thoughts. He also reports a decreased appetite, difficulty concentrating, decreased motivation, and fatigue. His PCP increased the Prozac a few days ago but he reports his symptoms worsened. He also felt mildly shaky on the Prozac. He contacted his PCP today who referred him here for evaluation. He endorses depression and states that he has thoughts passively about dying but would never take any action to kill himself. He has never thought of a suicide plan because he states he could never do that to himself. No HI or hallucinations. He denies any significant alcohol or drug use. He does not currently follow with a psychiatrist or therapist.   The history is provided by the patient.    Past Medical History:  Diagnosis Date  . Anxiety     Patient Active Problem List   Diagnosis Date Noted  . Obesity 02/12/2016  . Acute gross stress reaction 02/12/2016  . Elevated blood pressure 02/12/2016  . Adjustment disorder with mixed anxiety and depressed mood 02/12/2016  . Right foot injury 10/25/2011  . Achilles tendinitis 10/25/2011    Past Surgical History:  Procedure Laterality Date  . FOOT SURGERY         Home Medications    Prior to Admission medications   Medication Sig Start Date End Date Taking? Authorizing Provider  FLUoxetine (PROZAC) 20 MG capsule Take 20 mg by mouth daily.   Yes Historical Provider, MD  traZODone (DESYREL) 50 MG  tablet Take 1 tablet (50 mg total) by mouth at bedtime. As needed for sleep 02/29/16   Laurence Spatesachel Morgan Deyanira Fesler, MD    Family History Family History  Problem Relation Age of Onset  . Alcohol abuse Paternal Uncle   . Alcohol abuse Paternal Grandfather   . Sudden death Neg Hx   . Hyperlipidemia Neg Hx   . Heart attack Neg Hx   . Diabetes Neg Hx   . Hypertension Neg Hx     Social History Social History  Substance Use Topics  . Smoking status: Never Smoker  . Smokeless tobacco: Former NeurosurgeonUser    Types: Chew  . Alcohol use Yes     Comment: socially     Allergies   Codeine   Review of Systems Review of Systems 10 Systems reviewed and are negative for acute change except as noted in the HPI.   Physical Exam Updated Vital Signs BP 146/97 (BP Location: Right Arm)   Pulse 72   Temp 98.2 F (36.8 C) (Oral)   Resp 20   SpO2 96%   Physical Exam  Constitutional: He is oriented to person, place, and time. He appears well-developed and well-nourished. No distress.  HENT:  Head: Normocephalic and atraumatic.  Eyes: Conjunctivae are normal.  Neck: Neck supple.  Neurological: He is alert and oriented to person, place, and time.  Skin: Skin is warm and dry.  Psychiatric: Judgment normal.  Well-groomed; Flat affect, mildly  depressed mood, cooperative  Nursing note and vitals reviewed.    ED Treatments / Results  Labs (all labs ordered are listed, but only abnormal results are displayed) Labs Reviewed  COMPREHENSIVE METABOLIC PANEL - Abnormal; Notable for the following:       Result Value   CO2 21 (*)    Glucose, Bld 165 (*)    Alkaline Phosphatase 37 (*)    All other components within normal limits  ACETAMINOPHEN LEVEL - Abnormal; Notable for the following:    Acetaminophen (Tylenol), Serum <10 (*)    All other components within normal limits  CBC - Abnormal; Notable for the following:    Hemoglobin 17.7 (*)    MCHC 36.4 (*)    All other components within normal limits    ETHANOL  SALICYLATE LEVEL  URINE RAPID DRUG SCREEN, HOSP PERFORMED    EKG  EKG Interpretation None       Radiology No results found.  Procedures Procedures (including critical care time)  Medications Ordered in ED Medications - No data to display   Initial Impression / Assessment and Plan / ED Course  I have reviewed the triage vital signs and the nursing notes.  Pertinent labs that were available during my care of the patient were reviewed by me and considered in my medical decision making (see chart for details).  Clinical Course   Patient recently started on Prozac for anxiety and depression presents with worsening symptoms despite increase in medication recently. He was calm and cooperative on exam. He demonstrated good insight during conversation and good self awareness. I discussed his symptoms along with the TTS counselor. He vehemently denied any active SI with plan. He is willing to follow up with PCP and I have instructed him to continue his low-dose of Prozac until further instruction. Provided him with a short course of trazodone to use as needed for sleep aid. Emphasized the importance of finding a therapist and reviewed return precautions including any worsening symptoms. The patient voiced understanding and was comfortable with this plan. Discharged in satisfactory condition.  Final Clinical Impressions(s) / ED Diagnoses   Final diagnoses:  Depression  Passive suicidal ideations    New Prescriptions Discharge Medication List as of 02/29/2016  4:38 PM    START taking these medications   Details  traZODone (DESYREL) 50 MG tablet Take 1 tablet (50 mg total) by mouth at bedtime. As needed for sleep, Starting Mon 02/29/2016, Print         Laurence Spatesachel Morgan Arabia Nylund, MD 02/29/16 [phone]

## 2016-02-29 NOTE — Telephone Encounter (Signed)
Pt called back stating that the increased dose of fluoxetine is not helping.  States that he is crying uncontrollably and having more fatigue, is staying in bed all day, unable to work, still having suicidal thoughts.  He mentioned that Cherylann ParrAngela Tuttle, CMA mentioned an program through behavioral health.  Pt states that he is "in a crisis".  Please advise how to proceed.  Leroy Salazar. Leroy Salazar, CMA

## 2016-02-29 NOTE — Discharge Instructions (Signed)
Please follow-up with your primary care provider to discuss medication changes and continue 20 mg Prozac daily until further instructions by her primary care provider. You may take trazodone, 1 tablet at bedtime, as needed for sleep. Please discuss with your primary care provider a referral to a therapist. Please return to ER if your depression or suicidal thoughts worsen or if you develop any new symptoms.

## 2016-02-29 NOTE — Telephone Encounter (Signed)
Spoke with Dr. Sharee Holsterpalski who stated that pt needs to be evaluated STAT for psychiatric stability.  Contacted Paige at [facility], who states that [facility] for psychiatric evaluation.  Pt was advised of this and states that he is agreeable to proceed to ER later this afternoon after his son leaves for boot camp.  Pt states that his wife and mother will be with him ALL day for safety reasons.  Leroy Salazar. Nelson, CMA

## 2016-02-29 NOTE — ED Triage Notes (Signed)
Patient presents for SI without plan. Denies HI/AVH. Patient reports he has been on Prozac x10 days with no relief of SI/anxiety. Reports increased stress x12 years.

## 2016-03-02 ENCOUNTER — Encounter: Payer: Self-pay | Admitting: Family Medicine

## 2016-03-02 ENCOUNTER — Ambulatory Visit (INDEPENDENT_AMBULATORY_CARE_PROVIDER_SITE_OTHER): Payer: BLUE CROSS/BLUE SHIELD | Admitting: Family Medicine

## 2016-03-02 VITALS — BP 118/80 | HR 63 | Wt 263.8 lb

## 2016-03-02 DIAGNOSIS — F4323 Adjustment disorder with mixed anxiety and depressed mood: Secondary | ICD-10-CM | POA: Diagnosis not present

## 2016-03-02 DIAGNOSIS — F430 Acute stress reaction: Secondary | ICD-10-CM

## 2016-03-02 DIAGNOSIS — R030 Elevated blood-pressure reading, without diagnosis of hypertension: Secondary | ICD-10-CM | POA: Diagnosis not present

## 2016-03-02 DIAGNOSIS — IMO0001 Reserved for inherently not codable concepts without codable children: Secondary | ICD-10-CM

## 2016-03-02 DIAGNOSIS — E669 Obesity, unspecified: Secondary | ICD-10-CM | POA: Diagnosis not present

## 2016-03-02 NOTE — Progress Notes (Signed)
Impression and Recommendations:    1. Adjustment disorder with mixed anxiety and depressed mood   2. Acute gross stress reaction   3. Elevated blood pressure   4. Obesity     Acute gross stress reaction Has suicidal thoughts running through his mind at times but has absolutely no plan and tells me he would never leave his wife and family that way.  Tells me he has reached out to his father, sister and other family members recently whom he has not spoken to in years and years. He tells me he has a good support system and feels safe.   Patient prefers to follow-up with the EAP at his work.  He declines referral for counseling or psychiatry today.  He promised me that he will call today and get in for an appointment with them today or tomorrow and will express his severity of symptoms to them.  Discussed safety plan that if at anytime, having active suicidal thoughts or homicidal thoughts, pt agrees to call 911 or go to the local emergency room.    Elevated blood pressure Improved today. He did not bring in a log of his blood pressures is asked to from last office visit. Encouraged to do so in the future.    Obesity Patient states his obesity level depresses him and he would like to be healthier. Has decreased the amount of food he is been eating lately. He is happy he is losing weight but not this way. I agreed and encouraged Mediterranean type diet and not to focus on weight loss until he gets his mental health under control.  Of note patient is fasting today and we acquired labs that patient needed.   Adjustment disorder with mixed anxiety and depressed mood - Since patient is already weaning himself off medicines and does not have any side effects, I have discontinued the Prozac at this time since it is making his symptoms worse, and pt states he is "not taking that stuff anymore."  I have concerns for bipolar since putting him on the SSRI seemed to put him into a manic type  phase- couldn't sleep, racing thoughts, etc.  Patient understands that I recommend he go to a psychiatrist and be treated at this time.  Explained to him that this is beyond my expertise and requires a specialists evaluation.   He will call EAP at his work as he claims there have psychiatrists there, however I told them that is extremely rare and he should call and confirm that. I did tell him however if they have just counselors, it would be of great benefit to go to a counselor 1-2 times per week Pt was in the office today for 40+ minutes, with over 50% time spent in face to face counseling of various medical concerns and in coordination of care    AVS instructions to patient:  Stop your Prozac since you already weaned yourself off for several days and then continue trazodone as patient feels it helps him with sleep.  Total patient to call his work EAP program today and set up to see someone this week.  Needs to have an appointment with a M.D., D.O., NP, or PA to see him for medical management of his condition. These are providers that we'll be able to change his medicines to get him on the right regimen.  Patient understands also needs a counselor to go too for cognitive behavioral therapy and life coaching type counseling.  Discussed  safety plan that if at anytime, having active suicidal thoughts or homicidal thoughts, pt agrees to call 911 or go to the local emergency room.    Patient's Medications  New Prescriptions   No medications on file  Previous Medications   TRAZODONE (DESYREL) 50 MG TABLET    Take 1 tablet (50 mg total) by mouth at bedtime. As needed for sleep  Modified Medications   No medications on file  Discontinued Medications   FLUOXETINE (PROZAC) 20 MG CAPSULE    Take 20 mg by mouth daily.    Return if symptoms worsen or fail to improve.  The patient was counseled, risk factors were discussed, anticipatory guidance given.  Gross side effects, risk and benefits,  and alternatives of medications discussed with patient.  Patient is aware that all medications have potential side effects and we are unable to predict every side effect or drug-drug interaction that may occur.  Expresses verbal understanding and consents to current therapy plan and treatment regimen.  Please see AVS handed out to patient at the end of our visit for further patient instructions/ counseling done pertaining to today's office visit.    Note: This document was prepared using Dragon voice recognition software and may include unintentional dictation errors.   --------------------------------------------------------------------------------------------------------------------------------------------------------------------------------------------------------------------------------------------    Subjective:    CC:  Chief Complaint  Patient presents with  . Follow-up    HPI: Leroy SkainsGreg M Salazar is a 55 y.o. male who [facility] today for issues as discussed below.  Recently seen in the emergency room for depression and passive suicidal ideations.  Last seen in the office on 8/4- started prozac for pt for his sx. No effect for 10 d or more--> so we doubled the dose.  Pt states he then wasn't sleeping at all/ no restful sleep.  Made him more nervous, felt worse and hopeless on it.  So he dec dose Sunday 8/20 am.   He had little improvement in his symptoms.   My CMA spoke to him on 02/29/2016 and it was conveyed to me that patient had suicidal thoughts" and was "in crisis".   I asked the CMA to please call behavioral health and see if they have some type of an urgent care, where the patient can be seen today and evaluated for psychiatric stability.   It was told us that patient had to be seen [facility] for psychiatric evaluation.  Patient was seen 8/21 in the emergency room.  I have reviewed these notes in full.    Patient still endorses poor  sleep- which I prescribed trazodone.  initially it did help him sleep but he says now he is was not sleeping very well at all when we doubled the Prozac.  He has frequent awakenings at night and broken up sleep. Complains of racing thoughts and excess worry. He has trouble eating and has lost some weight since last OV. He cannot concentrate and lacks motivation to help himself.  I advised patient last office visit to get a counselor and patient did not. He tells me today that he has an EAP program at Work.    Patient endorses that they apparently have Psychiatrists patient can go be evaluated by Pandora Leiter( [facility] [place]  and counselors- pt left them a message.  And pt wishes to work with them, and declines referral once again, at this time    Wt Readings from Last 3 Encounters:  03/02/16 263 lb 12.8 oz (119.7 kg)  02/12/16 271 lb 12.8 oz (123.3 kg)  10/21/11 260 lb (117.9 kg)   BP Readings from Last 3 Encounters:  03/02/16 118/80  02/29/16 146/97  02/12/16 129/84   Pulse Readings from Last 3 Encounters:  03/02/16 63  02/29/16 72  02/12/16 (!) 56     Patient Active Problem List   Diagnosis Date Noted  . Obesity 02/12/2016  . Acute gross stress reaction 02/12/2016  . Elevated blood pressure 02/12/2016  . Adjustment disorder with mixed anxiety and depressed mood 02/12/2016  . Right foot injury 10/25/2011  . Achilles tendinitis 10/25/2011    Past Medical history, Surgical history, Family history, Social history, Allergies and Medications have been entered into the medical record, reviewed and changed as needed.   Allergies:  Allergies  Allergen Reactions  . Codeine Hives    Review of Systems: No fever/ chills, night sweats, no unintended weight loss, No chest pain, or increased shortness of breath. No N/V/D.  Pertinent positives and negatives noted in HPI above    Objective:   Blood pressure 118/80, pulse 63, weight 263 lb 12.8 oz (119.7  kg). Body mass index is 34.33 kg/m.  General: Well Developed, well nourished, appropriate for stated age.  Neuro: Alert and oriented x3, extra-ocular muscles intact, sensation grossly intact.  HEENT: Normocephalic, atraumatic, neck supple   Skin: Warm and dry, no gross rash. Cardiac: RRR, S1 S2,  no murmurs rubs or gallops.  Respiratory: ECTA B/L, Not using accessory muscles, speaking in full sentences-unlabored. Vascular:  No gross lower ext edema, cap RF less 2 sec. Psych:  + feeling hopeless, depressed, affect flat.  Psychomotor retardation.

## 2016-03-02 NOTE — Patient Instructions (Signed)
Stop your Prozac since you already weaned yourself off for several days and then continue trazodone as patient feels it helps him with sleep.  Total patient to call his work EAP program today and set up to see someone this week.  Needs to have an appointment with a M.D., D.O., NP, or PA to see him for medical management of his condition. These are providers that we'll be able to change his medicines to get him on the right regimen.  Patient understands also needs a counselor to go too for cognitive behavioral therapy and life coaching type counseling.  Discussed safety plan that if at anytime, having active suicidal thoughts or homicidal thoughts, pt agrees to call 911 or go to the local emergency room.

## 2016-03-03 ENCOUNTER — Telehealth: Payer: Self-pay

## 2016-03-03 LAB — LIPID PANEL
CHOL/HDL RATIO: 3.5 ratio (ref <=5.0)
Cholesterol: 153 mg/dL (ref 125–200)
HDL: 44 mg/dL (ref >=40)
LDL Cholesterol: 87 mg/dL (ref <130)
Triglycerides: 110 mg/dL (ref <150)
VLDL: 22 mg/dL (ref <30)

## 2016-03-03 LAB — HEMOGLOBIN A1C
HEMOGLOBIN A1C: 5.3 % (ref <5.7)
MEAN PLASMA GLUCOSE: 105 mg/dL

## 2016-03-03 LAB — VITAMIN D 25 HYDROXY (VIT D DEFICIENCY, FRACTURES): VIT D 25 HYDROXY: 28 ng/mL — AB (ref 30–100)

## 2016-03-03 LAB — TSH: TSH: 1.1 mIU/L (ref 0.40–4.50)

## 2016-03-03 NOTE — Telephone Encounter (Signed)
Follow up phone call regarding psych referral.  Pt states that he has an appointment on 03/04/2016 with Dr. Iona Hansenarey Cottle's office.  Pt states that he is stable at this time.  Leroy Salazar. Jolyn Deshmukh, CMA

## 2016-03-04 DIAGNOSIS — F4325 Adjustment disorder with mixed disturbance of emotions and conduct: Secondary | ICD-10-CM | POA: Diagnosis not present

## 2016-03-05 NOTE — Assessment & Plan Note (Signed)
Improved today. He did not bring in a log of his blood pressures is asked to from last office visit. Encouraged to do so in the future.

## 2016-03-05 NOTE — Assessment & Plan Note (Addendum)
-   Since patient is already weaning himself off medicines and does not have any side effects, I have discontinued the Prozac at this time since it is making his symptoms worse, and pt states he is "not taking that stuff anymore."  I have concerns for bipolar since putting him on the SSRI seemed to put him into a manic type phase- couldn't sleep, racing thoughts, etc.  Patient understands that I recommend he go to a psychiatrist and be treated at this time.  Explained to him that this is beyond my expertise and requires a specialists evaluation.   He will call EAP at his work as he claims there have psychiatrists there, however I told them that is extremely rare and he should call and confirm that. I did tell him however if they have just counselors, it would be of great benefit to go to a counselor 1-2 times per week

## 2016-03-05 NOTE — Assessment & Plan Note (Addendum)
Patient states his obesity level depresses him and he would like to be healthier. Has decreased the amount of food he is been eating lately. He is happy he is losing weight but not this way. I agreed and encouraged Mediterranean type diet and not to focus on weight loss until he gets his mental health under control.  Of note patient is fasting today and we acquired labs that patient needed.

## 2016-03-05 NOTE — Assessment & Plan Note (Signed)
Has suicidal thoughts running through his mind at times but has absolutely no plan and tells me he would never leave his wife and family that way.  Tells me he has reached out to his father, sister and other family members recently whom he has not spoken to in years and years. He tells me he has a good support system and feels safe.   Patient prefers to follow-up with the EAP at his work.  He declines referral for counseling or psychiatry today.  He promised me that he will call today and get in for an appointment with them today or tomorrow and will express his severity of symptoms to them.  Discussed safety plan that if at anytime, having active suicidal thoughts or homicidal thoughts, pt agrees to call 911 or go to the local emergency room.

## 2016-03-10 ENCOUNTER — Ambulatory Visit: Payer: BLUE CROSS/BLUE SHIELD | Admitting: Family Medicine

## 2016-08-24 ENCOUNTER — Ambulatory Visit (INDEPENDENT_AMBULATORY_CARE_PROVIDER_SITE_OTHER): Payer: BLUE CROSS/BLUE SHIELD | Admitting: Adult Health

## 2016-08-24 ENCOUNTER — Encounter: Payer: Self-pay | Admitting: Adult Health

## 2016-08-24 DIAGNOSIS — H1031 Unspecified acute conjunctivitis, right eye: Secondary | ICD-10-CM | POA: Diagnosis not present

## 2016-08-24 DIAGNOSIS — H109 Unspecified conjunctivitis: Secondary | ICD-10-CM | POA: Insufficient documentation

## 2016-08-24 DIAGNOSIS — H6691 Otitis media, unspecified, right ear: Secondary | ICD-10-CM

## 2016-08-24 MED ORDER — CIPROFLOXACIN HCL 0.3 % OP SOLN: 1.0000 [drp] | OPHTHALMIC | 1 refills | Status: DC

## 2016-08-24 MED ORDER — AMOXICILLIN-POT CLAVULANATE 875-125 MG PO TABS: 1.0000 | ORAL_TABLET | Freq: Two times a day (BID) | ORAL | 0 refills | Status: DC

## 2016-08-24 NOTE — Progress Notes (Signed)
Subjective:    Patient ID: Leroy Salazar, male    DOB: 05-19-1961, 56 y.o.   MRN: [id number]  HPI:  Leroy Salazar presents with right ear fullness/decreased hearing, and right eye redness/stinging that started yesterday.  He has had clear nasal congestion and non-productive cough that improved over the weekend (URI sx's present > 1.5 weeks).  He denies fever/night sweats/CP/dyspnea at rest (did get a little winded when swimming laps today). He has been taking OTC Mucinex that has "helped somewhat".   Patient Care Team    Relationship Specialty Notifications Start End  Thomasene Lot, DO PCP - General Family Medicine  02/11/16     Patient Active Problem List   Diagnosis Date Noted  . Otitis media of right ear 08/24/2016  . Conjunctivitis 08/24/2016  . Obesity 02/12/2016  . Acute gross stress reaction 02/12/2016  . Elevated blood pressure 02/12/2016  . Adjustment disorder with mixed anxiety and depressed mood 02/12/2016  . Right foot injury 10/25/2011  . Achilles tendinitis 10/25/2011     Past Medical History:  Diagnosis Date  . Anxiety   . Depression      Past Surgical History:  Procedure Laterality Date  . FOOT SURGERY       Family History  Problem Relation Age of Onset  . Alcohol abuse Paternal Uncle   . Alcohol abuse Paternal Grandfather   . Sudden death Neg Hx   . Hyperlipidemia Neg Hx   . Heart attack Neg Hx   . Diabetes Neg Hx   . Hypertension Neg Hx      History  Drug Use No     History  Alcohol Use  . Yes    Comment: socially     History  Smoking Status  . Never Smoker  Smokeless Tobacco  . Former Neurosurgeon  . Types: Chew     Outpatient Encounter Prescriptions as of 08/24/2016  Medication Sig  . traZODone (DESYREL) 50 MG tablet Take 1 tablet (50 mg total) by mouth at bedtime. As needed for sleep  . amoxicillin-clavulanate (AUGMENTIN) 875-125 MG tablet Take 1 tablet by mouth 2 (two) times daily.  . ciprofloxacin (CILOXAN) 0.3 % ophthalmic  solution Place 1 drop into the right eye every 2 (two) hours. Administer 1 drop, every 2 hours, while awake, for 2 days. Then 1 drop, every 4 hours, while awake, for the next 5 days.   No facility-administered encounter medications on file as of 08/24/2016.     Allergies: Codeine  Body mass index is 36.78 kg/m.  Blood pressure (!) 138/92, pulse 77, temperature 98.4 F (36.9 C), temperature source Oral, height 6' 1.5" (1.867 m), weight 282 lb 9.6 oz (128.2 kg).     Review of Systems  Constitutional: Negative for activity change, appetite change, chills, diaphoresis, fatigue, fever and unexpected weight change.  HENT: Positive for congestion, ear pain, facial swelling, postnasal drip, sinus pressure and sneezing. Negative for sore throat, tinnitus, trouble swallowing and voice change.   Eyes: Positive for redness. Negative for visual disturbance.  Respiratory: Positive for cough. Negative for shortness of breath, wheezing and stridor.   Cardiovascular: Negative for chest pain, palpitations and leg swelling.  Gastrointestinal: Negative for abdominal distention, abdominal pain, nausea and vomiting.  Endocrine: Negative for cold intolerance, heat intolerance, polydipsia, polyphagia and polyuria.  Genitourinary: Negative for difficulty urinating and flank pain.  Skin: Negative for color change, pallor, rash and wound.  Neurological: Negative for dizziness.       Objective:   Physical  Exam  Constitutional: He is oriented to person, place, and time. He appears well-developed and well-nourished. He appears distressed.  HENT:  Head: Normocephalic and atraumatic.  Right Ear: There is swelling. Tympanic membrane is erythematous and bulging. Decreased hearing is noted.  Left Ear: No swelling or tenderness. Tympanic membrane is not erythematous and not bulging. No decreased hearing is noted.  Nose: Mucosal edema and rhinorrhea present. Right sinus exhibits maxillary sinus tenderness and  frontal sinus tenderness.  Mouth/Throat: Posterior oropharyngeal edema and posterior oropharyngeal erythema present. No tonsillar abscesses.  Eyes: Pupils are equal, round, and reactive to light. Right eye exhibits discharge. Left eye exhibits no discharge.  Right conjunctiva excessively reddened with watery drainage.  Cardiovascular: Normal rate, regular rhythm, normal heart sounds and intact distal pulses.   Pulmonary/Chest: Effort normal and breath sounds normal. No respiratory distress. He has no wheezes. He has no rales. He exhibits no tenderness.  Neurological: He is alert and oriented to person, place, and time. He has normal reflexes.  Skin: Skin is warm and dry. He is not diaphoretic.  Psychiatric: He has a normal mood and affect. His behavior is normal. Judgment and thought content normal.  Nursing note and vitals reviewed.         Assessment & Plan:   1. Right otitis media, unspecified otitis media type   2. Acute bacterial conjunctivitis of right eye     Otitis media of right ear Augmentin Rx provided. Increase water/rest/vit c. Alternate OTC Acetaminophen and Ibuprofen as needed.  Conjunctivitis Cipro eye gtt's provided. Keep hands off eye.  Wash pillow case.    FOLLOW-UP:  Return if symptoms worsen or fail to improve.

## 2016-08-24 NOTE — Assessment & Plan Note (Signed)
Cipro eye gtt's provided. Keep hands off eye.  Wash pillow case.

## 2016-08-24 NOTE — Assessment & Plan Note (Signed)
Augmentin Rx provided. Increase water/rest/vit c. Alternate OTC Acetaminophen and Ibuprofen as needed.

## 2016-08-24 NOTE — Patient Instructions (Signed)
Sinusitis, Adult Sinusitis is soreness and inflammation of your sinuses. Sinuses are hollow spaces in the bones around your face. They are located:  Around your eyes.  In the middle of your forehead.  Behind your nose.  In your cheekbones. Your sinuses and nasal passages are lined with a stringy fluid (mucus). Mucus normally drains out of your sinuses. When your nasal tissues get inflamed or swollen, the mucus can get trapped or blocked so air cannot flow through your sinuses. This lets bacteria, viruses, and funguses grow, and that leads to infection. Follow these instructions at home: Medicines  Take, use, or apply over-the-counter and prescription medicines only as told by your doctor. These may include nasal sprays.  If you were prescribed an antibiotic medicine, take it as told by your doctor. Do not stop taking the antibiotic even if you start to feel better. Hydrate and Humidify  Drink enough water to keep your pee (urine) clear or pale yellow.  Use a cool mist humidifier to keep the humidity level in your home above 50%.  Breathe in steam for 10-15 minutes, 3-4 times a day or as told by your doctor. You can do this in the bathroom while a hot shower is running.  Try not to spend time in cool or dry air. Rest  Rest as much as possible.  Sleep with your head raised (elevated).  Make sure to get enough sleep each night. General instructions  Put a warm, moist washcloth on your face 3-4 times a day or as told by your doctor. This will help with discomfort.  Wash your hands often with soap and water. If there is no soap and water, use hand sanitizer.  Do not smoke. Avoid being around people who are smoking (secondhand smoke).  Keep all follow-up visits as told by your doctor. This is important. Contact a doctor if:  You have a fever.  Your symptoms get worse.  Your symptoms do not get better within 10 days. Get help right away if:  You have a very bad  headache.  You cannot stop throwing up (vomiting).  You have pain or swelling around your face or eyes.  You have trouble seeing.  You feel confused.  Your neck is stiff.  You have trouble breathing. This information is not intended to replace advice given to you by your health care provider. Make sure you discuss any questions you have with your health care provider. Document Released: 12/14/2007 Document Revised: 02/21/2016 Document Reviewed: 04/22/2015 Elsevier Interactive Patient Education  2017 [employer]. Otitis Media, Adult Otitis media is redness, soreness, and puffiness (swelling) in the space just behind your eardrum (middle ear). It may be caused by allergies or infection. It often happens along with a cold. Follow these instructions at home:  Take your medicine as told. Finish it even if you start to feel better.  Only take over-the-counter or prescription medicines for pain, discomfort, or fever as told by your doctor.  Follow up with your doctor as told. Contact a doctor if:  You have otitis media only in one ear, or bleeding from your nose, or both.  You notice a lump on your neck.  You are not getting better in 3-5 days.  You feel worse instead of better. Get help right away if:  You have pain that is not helped with medicine.  You have puffiness, redness, or pain around your ear.  You get a stiff neck.  You cannot move part of your face (paralysis).  You notice that the bone behind your ear hurts when you touch it. This information is not intended to replace advice given to you by your health care provider. Make sure you discuss any questions you have with your health care provider. Document Released: 12/14/2007 Document Revised: 12/03/2015 Document Reviewed: 01/22/2013 Elsevier Interactive Patient Education  2017 [employer]   Increase water/vit c/rest. Alternate OTC Acetaminophen and Ibuprofen per manufacturer's instructions. If symptoms  continue after antibiotic completed, then please return to clinic.

## 2016-09-12 ENCOUNTER — Ambulatory Visit (INDEPENDENT_AMBULATORY_CARE_PROVIDER_SITE_OTHER): Payer: BLUE CROSS/BLUE SHIELD | Admitting: Adult Health

## 2016-09-12 ENCOUNTER — Encounter: Payer: Self-pay | Admitting: Adult Health

## 2016-09-12 DIAGNOSIS — J40 Bronchitis, not specified as acute or chronic: Secondary | ICD-10-CM

## 2016-09-12 MED ORDER — HYDROCOD POLST-CPM POLST ER 10-8 MG PO CP12: 1.0000 | ORAL_CAPSULE | Freq: Two times a day (BID) | ORAL | 0 refills | Status: DC | PRN

## 2016-09-12 MED ORDER — AZITHROMYCIN 250 MG PO TABS: ORAL_TABLET | ORAL | 0 refills | Status: DC

## 2016-09-12 MED ORDER — HYDROCODONE-HOMATROPINE 5-1.5 MG/5ML PO SYRP: 5.0000 mL | ORAL_SOLUTION | Freq: Three times a day (TID) | ORAL | 0 refills | Status: DC | PRN

## 2016-09-12 NOTE — Progress Notes (Signed)
Subjective:    Patient ID: Corliss SkainsGreg M Vassey, male    DOB: Jan 27, 1961, 56 y.o.   MRN: [id number]  HPI:  Mr. Dorinda Hillsyher is here for continued, constant, non-productive cough.  He completed course of Augmentin from this clinic and another [facility] when he was [facility] last month.  He reports generalized fatigue, fever/night sweats over the last week.  He denies CP/dyspnea at rest/hx of asthma.  He was been pushing water and does not use tobacco.     Patient Care Team    Relationship Specialty Notifications Start End  Thomasene Loteborah Opalski, DO PCP - General Family Medicine  02/11/16     Patient Active Problem List   Diagnosis Date Noted  . Bronchitis 09/12/2016  . Otitis media of right ear 08/24/2016  . Conjunctivitis 08/24/2016  . Obesity 02/12/2016  . Acute gross stress reaction 02/12/2016  . Elevated blood pressure 02/12/2016  . Adjustment disorder with mixed anxiety and depressed mood 02/12/2016  . Right foot injury 10/25/2011  . Achilles tendinitis 10/25/2011     Past Medical History:  Diagnosis Date  . Anxiety   . Depression      Past Surgical History:  Procedure Laterality Date  . FOOT SURGERY       Family History  Problem Relation Age of Onset  . Alcohol abuse Paternal Uncle   . Alcohol abuse Paternal Grandfather   . Sudden death Neg Hx   . Hyperlipidemia Neg Hx   . Heart attack Neg Hx   . Diabetes Neg Hx   . Hypertension Neg Hx      History  Drug Use No     History  Alcohol Use  . Yes    Comment: socially     History  Smoking Status  . Never Smoker  Smokeless Tobacco  . Former NeurosurgeonUser  . Types: Chew     Outpatient Encounter Prescriptions as of 09/12/2016  Medication Sig  . traZODone (DESYREL) 50 MG tablet Take 1 tablet (50 mg total) by mouth at bedtime. As needed for sleep  . [DISCONTINUED] amoxicillin-clavulanate (AUGMENTIN) 875-125 MG tablet Take 1 tablet by mouth 2 (two) times daily.  . [DISCONTINUED] ciprofloxacin (CILOXAN) 0.3 %  ophthalmic solution Place 1 drop into the right eye every 2 (two) hours. Administer 1 drop, every 2 hours, while awake, for 2 days. Then 1 drop, every 4 hours, while awake, for the next 5 days.  Marland Kitchen. azithromycin (ZITHROMAX) 250 MG tablet 2 tablets by mouth day one.  1 tablet by mouth days two-five.  Take with food.  . Hydrocod Polst-Chlorphen Polst (TUSSICAPS) 10-8 MG CP12 Take 1 capsule by mouth 2 (two) times daily as needed.   No facility-administered encounter medications on file as of 09/12/2016.     Allergies: Codeine  Body mass index is 36.82 kg/m.  Blood pressure 139/87, pulse 81, temperature 98.1 F (36.7 C), temperature source Oral, height 6' 1.5" (1.867 m), weight 282 lb 14.4 oz (128.3 kg).     Review of Systems  Constitutional: Positive for activity change, fatigue and fever. Negative for appetite change, chills, diaphoresis and unexpected weight change.  HENT: Positive for congestion and sinus pressure. Negative for sneezing, sore throat, tinnitus, trouble swallowing and voice change.   Eyes: Negative for visual disturbance.  Respiratory: Positive for cough. Negative for chest tightness, shortness of breath, wheezing and stridor.   Cardiovascular: Negative for chest pain, palpitations and leg swelling.  Gastrointestinal: Negative for abdominal distention, constipation, diarrhea, nausea and vomiting.  Endocrine: Negative for cold intolerance, heat intolerance, polydipsia, polyphagia and polyuria.  Genitourinary: Negative for difficulty urinating and flank pain.  Skin: Negative for color change, pallor, rash and wound.  Neurological: Negative for dizziness, tremors, weakness, light-headedness, numbness and headaches.       Objective:   Physical Exam  Constitutional: He is oriented to person, place, and time. He appears well-developed and well-nourished. No distress.  HENT:  Head: Normocephalic and atraumatic.  Right Ear: Hearing, tympanic membrane and external ear normal.   Left Ear: Hearing, tympanic membrane and external ear normal.  Nose: Mucosal edema and rhinorrhea present. No sinus tenderness.  Mouth/Throat: Uvula is midline. No oropharyngeal exudate, posterior oropharyngeal edema, posterior oropharyngeal erythema or tonsillar abscesses.  Neurological: He is alert and oriented to person, place, and time.  Skin: Skin is warm and dry. No rash noted. He is not diaphoretic. No erythema. No pallor.  Psychiatric: He has a normal mood and affect. His behavior is normal. Judgment and thought content normal.  Nursing note and vitals reviewed.         Assessment & Plan:   1. Bronchitis     Bronchitis Failed two courses of Augmentin. Started on Azithromycin and Tussicaps-take as directed. DEA registry verified, no contraindications to Tussicaps Rx. Continue to increase water/rest/vit c. RTC if sx's persist after ABS completed.     FOLLOW-UP:  Return if symptoms worsen or fail to improve.

## 2016-09-12 NOTE — Patient Instructions (Signed)

## 2016-09-12 NOTE — Assessment & Plan Note (Addendum)
Failed two courses of Augmentin. Started on Azithromycin and Tussicaps-take as directed. DEA registry verified, no contraindications to Tussicaps Rx. Continue to increase water/rest/vit c. RTC if sx's persist after ABS completed.

## 2016-09-12 NOTE — Addendum Note (Signed)
Addended by: Laurance FlattenBESS, Temil[place]wa [place] D on: 09/12/2016 10:06 AM   Modules accepted: Orders

## 2017-02-06 DIAGNOSIS — L570 Actinic keratosis: Secondary | ICD-10-CM | POA: Diagnosis not present

## 2017-02-06 DIAGNOSIS — D2261 Melanocytic nevi of right upper limb, including shoulder: Secondary | ICD-10-CM | POA: Diagnosis not present

## 2017-10-17 ENCOUNTER — Ambulatory Visit (INDEPENDENT_AMBULATORY_CARE_PROVIDER_SITE_OTHER): Payer: BLUE CROSS/BLUE SHIELD | Admitting: Adult Health

## 2017-10-17 ENCOUNTER — Encounter: Payer: Self-pay | Admitting: Adult Health

## 2017-10-17 VITALS — BP 120/74 | HR 73 | Temp 97.6°F | Ht 73.5 in | Wt 278.7 lb

## 2017-10-17 DIAGNOSIS — D2272 Melanocytic nevi of left lower limb, including hip: Secondary | ICD-10-CM | POA: Diagnosis not present

## 2017-10-17 DIAGNOSIS — L570 Actinic keratosis: Secondary | ICD-10-CM | POA: Diagnosis not present

## 2017-10-17 DIAGNOSIS — D225 Melanocytic nevi of trunk: Secondary | ICD-10-CM | POA: Diagnosis not present

## 2017-10-17 DIAGNOSIS — X32XXXA Exposure to sunlight, initial encounter: Secondary | ICD-10-CM | POA: Diagnosis not present

## 2017-10-17 DIAGNOSIS — R197 Diarrhea, unspecified: Secondary | ICD-10-CM | POA: Diagnosis not present

## 2017-10-17 DIAGNOSIS — D2261 Melanocytic nevi of right upper limb, including shoulder: Secondary | ICD-10-CM | POA: Diagnosis not present

## 2017-10-17 DIAGNOSIS — D2262 Melanocytic nevi of left upper limb, including shoulder: Secondary | ICD-10-CM | POA: Diagnosis not present

## 2017-10-17 NOTE — Progress Notes (Addendum)
Subjective:    Patient ID: Leroy Salazar, male    DOB: [birth year], 57 y.o.   MRN: [id number]  HPI:  Leroy Salazar presents with watery diarrhea for the last six days.  Stool is brown/green, denies hematuria/hematochezia. He denies abdominal pain/fever/night sweats/N/V. He denies CP/dyspnea/palpitations. He started OTC Imodium over weekend, last dose yesterday- he stopped b/c "it wasn't really helping" He recently travelled to [place], [place], [place], [place] and last[place]. He was out of country 3/20/190 10/12/17. Diarrhea started 10/11/17-when he was in [place]. He is avid traveler, usually [place].  Patient Care Team    Relationship Specialty Notifications Start End  Thomasene Lotpalski, Deborah, DO PCP - General Family Medicine  02/11/16     Patient Active Problem List   Diagnosis Date Noted  . Bronchitis 09/12/2016  . Otitis media of right ear 08/24/2016  . Conjunctivitis 08/24/2016  . Obesity 02/12/2016  . Acute gross stress reaction 02/12/2016  . Elevated blood pressure 02/12/2016  . Adjustment disorder with mixed anxiety and depressed mood 02/12/2016  . Right foot injury 10/25/2011  . Achilles tendinitis 10/25/2011     Past Medical History:  Diagnosis Date  . Anxiety   . Depression      Past Surgical History:  Procedure Laterality Date  . FOOT SURGERY       Family History  Problem Relation Age of Onset  . Alcohol abuse Paternal Uncle   . Alcohol abuse Paternal Grandfather   . Sudden death Neg Hx   . Hyperlipidemia Neg Hx   . Heart attack Neg Hx   . Diabetes Neg Hx   . Hypertension Neg Hx      Social History   Substance and Sexual Activity  Drug Use No     Social History   Substance and Sexual Activity  Alcohol Use Yes   Comment: socially     Social History   Tobacco Use  Smoking Status Never Smoker  Smokeless Tobacco Former NeurosurgeonUser  . Types: Chew     Outpatient Encounter Medications as of 10/17/2017  Medication Sig  . traZODone  (DESYREL) 50 MG tablet Take 1 tablet (50 mg total) by mouth at bedtime. As needed for sleep  . [DISCONTINUED] azithromycin (ZITHROMAX) 250 MG tablet 2 tablets by mouth day one.  1 tablet by mouth days two-five.  Take with food.  . [DISCONTINUED] HYDROcodone-homatropine (HYCODAN) 5-1.5 MG/5ML syrup Take 5 mLs by mouth every 8 (eight) hours as needed for cough.   No facility-administered encounter medications on file as of 10/17/2017.     Allergies: Codeine  Body mass index is 36.27 kg/m.  Blood pressure 120/74, pulse 73, temperature 97.6 F (36.4 C), temperature source Oral, height 6' 1.5" (1.867 m), weight 278 lb 11.2 oz (126.4 kg), SpO2 94 %.    Review of Systems  Constitutional: Positive for fatigue. Negative for activity change, appetite change, chills, diaphoresis, fever and unexpected weight change.  Eyes: Negative for visual disturbance.  Respiratory: Negative for cough, chest tightness, shortness of breath, wheezing and stridor.   Cardiovascular: Negative for chest pain, palpitations and leg swelling.  Gastrointestinal: Positive for diarrhea. Negative for abdominal distention, abdominal pain, anal bleeding, blood in stool, constipation, nausea and vomiting.  Genitourinary: Negative for difficulty urinating, flank pain and hematuria.  Neurological: Negative for dizziness and headaches.  Hematological: Does not bruise/bleed easily.  Psychiatric/Behavioral: Negative for hallucinations, self-injury, sleep disturbance and suicidal ideas. The patient is not nervous/anxious and is not hyperactive.  Objective:   Physical Exam  Constitutional: He appears well-developed and well-nourished. No distress.  HENT:  Head: Normocephalic and atraumatic.  Right Ear: External ear normal.  Left Ear: External ear normal.  Eyes: Pupils are equal, round, and reactive to light. Conjunctivae are normal.  Cardiovascular: Normal rate, regular rhythm, normal heart sounds and intact distal  pulses.  No murmur heard. Pulmonary/Chest: Effort normal and breath sounds normal. No respiratory distress. He has no wheezes. He has no rales. He exhibits no tenderness.  Abdominal: Soft. Bowel sounds are normal. He exhibits no distension and no mass. There is no tenderness. There is no rigidity, no rebound, no guarding, no CVA tenderness, no tenderness at McBurney's point and negative Murphy's sign.  Hyperactive BS in all quadrants   Skin: Skin is warm and dry. No rash noted. He is not diaphoretic. No erythema. No pallor.  Psychiatric: He has a normal mood and affect. His behavior is normal. Judgment and thought content normal.  Nursing note and vitals reviewed.     Assessment & Plan:   1. Diarrhea of presumed infectious origin     Diarrhea of presumed infectious origin Continue to push fluids. We will call you when lab/stool culture results are available. Please call clinic with any questions/concerns.    FOLLOW-UP:  Return if symptoms worsen or fail to improve.

## 2017-10-17 NOTE — Assessment & Plan Note (Signed)
Continue to push fluids. We will call you when lab/stool culture results are available. Please call clinic with any questions/concerns.

## 2017-10-17 NOTE — Patient Instructions (Signed)
Diarrhea, Adult Diarrhea is frequent loose and watery bowel movements. Diarrhea can make you feel weak and cause you to become dehydrated. Dehydration can make you tired and thirsty, cause you to have a dry mouth, and decrease how often you urinate. Diarrhea typically lasts 2-3 days. However, it can last longer if it is a sign of something more serious. It is important to treat your diarrhea as told by your health care provider. Follow these instructions at home: Eating and drinking  Follow these recommendations as told by your health care provider:  Take an oral rehydration solution (ORS). This is a drink that is sold at pharmacies and retail stores.  Drink clear fluids, such as water, ice chips, diluted fruit juice, and low-calorie sports drinks.  Eat bland, easy-to-digest foods in small amounts as you are able. These foods include bananas, applesauce, rice, lean meats, toast, and crackers.  Avoid drinking fluids that contain a lot of sugar or caffeine, such as energy drinks, sports drinks, and soda.  Avoid alcohol.  Avoid spicy or fatty foods.  General instructions  Drink enough fluid to keep your urine clear or pale yellow.  Wash your hands often. If soap and water are not available, use hand sanitizer.  Make sure that all people in your household wash their hands well and often.  Take over-the-counter and prescription medicines only as told by your health care provider.  Rest at home while you recover.  Watch your condition for any changes.  Take a warm bath to relieve any burning or pain from frequent diarrhea episodes.  Keep all follow-up visits as told by your health care provider. This is important. Contact a health care provider if:  You have a fever.  Your diarrhea gets worse.  You have new symptoms.  You cannot keep fluids down.  You feel light-headed or dizzy.  You have a headache  You have muscle cramps. Get help right away if:  You have chest  pain.  You feel extremely weak or you faint.  You have bloody or black stools or stools that look like tar.  You have severe pain, cramping, or bloating in your abdomen.  You have trouble breathing or you are breathing very quickly.  Your heart is beating very quickly.  Your skin feels cold and clammy.  You feel confused.  You have signs of dehydration, such as: ? Dark urine, very little urine, or no urine. ? Cracked lips. ? Dry mouth. ? Sunken eyes. ? Sleepiness. ? Weakness. This information is not intended to replace advice given to you by your health care provider. Make sure you discuss any questions you have with your health care provider. Document Released: 06/17/2002 Document Revised: 11/05/2015 Document Reviewed: 03/03/2015 Elsevier Interactive Patient Education  2018 [employer]  Continue to push fluids. We will call you when lab/stool culture results are available. Please call clinic with any questions/concerns. FEEL BETTER!

## 2017-10-18 ENCOUNTER — Other Ambulatory Visit: Payer: Self-pay

## 2017-10-18 DIAGNOSIS — R197 Diarrhea, unspecified: Secondary | ICD-10-CM

## 2017-10-18 LAB — COMPREHENSIVE METABOLIC PANEL
ALK PHOS: 44 IU/L (ref 39–117)
ALT: 25 IU/L (ref 0–44)
AST: 22 IU/L (ref 0–40)
Albumin/Globulin Ratio: 2.3 — ABNORMAL HIGH (ref 1.2–2.2)
Albumin: 4.8 g/dL (ref 3.5–5.5)
BILIRUBIN TOTAL: 0.5 mg/dL (ref 0.0–1.2)
BUN/Creatinine Ratio: 16 (ref 9–20)
BUN: 18 mg/dL (ref 6–24)
CHLORIDE: 103 mmol/L (ref 96–106)
CO2: 21 mmol/L (ref 20–29)
Calcium: 9.6 mg/dL (ref 8.7–10.2)
Creatinine, Ser: 1.1 mg/dL (ref 0.76–1.27)
GFR calc Af Amer: 88 mL/min/{1.73_m2} (ref >59)
GFR, EST NON AFRICAN AMERICAN: 76 mL/min/{1.73_m2} (ref >59)
GLOBULIN, TOTAL: 2.1 g/dL (ref 1.5–4.5)
Glucose: 104 mg/dL — ABNORMAL HIGH (ref 65–99)
POTASSIUM: 3.9 mmol/L (ref 3.5–5.2)
SODIUM: 141 mmol/L (ref 134–144)
Total Protein: 6.9 g/dL (ref 6.0–8.5)

## 2017-10-23 LAB — STOOL CULTURE: E COLI SHIGA TOXIN ASSAY: NEGATIVE

## 2017-10-25 LAB — OVA AND PARASITE EXAMINATION

## 2018-04-23 DIAGNOSIS — X32XXXA Exposure to sunlight, initial encounter: Secondary | ICD-10-CM | POA: Diagnosis not present

## 2018-04-23 DIAGNOSIS — L570 Actinic keratosis: Secondary | ICD-10-CM | POA: Diagnosis not present

## 2018-07-31 ENCOUNTER — Encounter: Payer: Self-pay | Admitting: Adult Health

## 2018-07-31 ENCOUNTER — Ambulatory Visit: Payer: BLUE CROSS/BLUE SHIELD | Admitting: Adult Health

## 2018-07-31 DIAGNOSIS — J40 Bronchitis, not specified as acute or chronic: Secondary | ICD-10-CM | POA: Diagnosis not present

## 2018-07-31 MED ORDER — HYDROCOD POLST-CPM POLST ER 10-8 MG/5ML PO SUER: 5.0000 mL | Freq: Two times a day (BID) | ORAL | 0 refills | Status: DC | PRN

## 2018-07-31 MED ORDER — FLUTICASONE PROPIONATE 50 MCG/ACT NA SUSP: 2.0000 | Freq: Every day | NASAL | 2 refills | Status: DC

## 2018-07-31 MED ORDER — AZITHROMYCIN 250 MG PO TABS: ORAL_TABLET | ORAL | 0 refills | Status: DC

## 2018-07-31 NOTE — Patient Instructions (Signed)

## 2018-07-31 NOTE — Progress Notes (Signed)
Subjective:    Patient ID: Leroy Salazar, male    DOB: [birth year], 58 y.o.   MRN: [id number]  HPI:  Leroy Salazar presents with non-productive cough, clear nasal drainage, and pronounced fatigue the last 3 weeks. He denies CP/fever/night sweats/chills/N/V/D He has been drinking Ginger Tea and using OTC Mucinex He reports easily getting URI several times yearly- frequent air travel and high stress life Discussed wearing face masks when flying and other protective measures to reduce exposure He continues to abstain from tobacco/vape use  Patient Care Team    Relationship Specialty Notifications Start End  Thomasene Lot, DO PCP - General Family Medicine  02/11/16     Patient Active Problem List   Diagnosis Date Noted  . Diarrhea of presumed infectious origin 10/17/2017  . Bronchitis 09/12/2016  . Otitis media of right ear 08/24/2016  . Conjunctivitis 08/24/2016  . Obesity 02/12/2016  . Acute gross stress reaction 02/12/2016  . Elevated blood pressure 02/12/2016  . Adjustment disorder with mixed anxiety and depressed mood 02/12/2016  . Right foot injury 10/25/2011  . Achilles tendinitis 10/25/2011     Past Medical History:  Diagnosis Date  . Anxiety   . Depression      Past Surgical History:  Procedure Laterality Date  . FOOT SURGERY       Family History  Problem Relation Age of Onset  . Alcohol abuse Paternal Uncle   . Alcohol abuse Paternal Grandfather   . Sudden death Neg Hx   . Hyperlipidemia Neg Hx   . Heart attack Neg Hx   . Diabetes Neg Hx   . Hypertension Neg Hx      Social History   Substance and Sexual Activity  Drug Use No     Social History   Substance and Sexual Activity  Alcohol Use Yes   Comment: socially     Social History   Tobacco Use  Smoking Status Never Smoker  Smokeless Tobacco Former Neurosurgeon  . Types: Chew     Outpatient Encounter Medications as of 07/31/2018  Medication Sig  . traZODone (DESYREL) 50 MG tablet Take 1  tablet (50 mg total) by mouth at bedtime. As needed for sleep  . azithromycin (ZITHROMAX) 250 MG tablet 2 tabs day one. 1 tab days two-five  . chlorpheniramine-HYDROcodone (TUSSIONEX) 10-8 MG/5ML SUER Take 5 mLs by mouth every 12 (twelve) hours as needed.  . fluticasone (FLONASE) 50 MCG/ACT nasal spray Place 2 sprays into both nostrils daily.   No facility-administered encounter medications on file as of 07/31/2018.     Allergies: Codeine  Body mass index is 37.6 kg/m.  Blood pressure 124/79, pulse 88, temperature 98.5 F (36.9 C), temperature source Oral, height 6' 1.5" (1.867 m), weight 288 lb 14.4 oz (131 kg), SpO2 94 %.  Review of Systems  Constitutional: Positive for activity change and fatigue. Negative for appetite change, chills, diaphoresis, fever and unexpected weight change.  HENT: Positive for congestion, postnasal drip, rhinorrhea and sinus pressure. Negative for facial swelling, sinus pain, sore throat, trouble swallowing and voice change.   Eyes: Negative for visual disturbance.  Respiratory: Positive for cough and shortness of breath. Negative for chest tightness, wheezing and stridor.   Cardiovascular: Negative for chest pain, palpitations and leg swelling.  Gastrointestinal: Negative for abdominal distention, blood in stool, constipation, diarrhea, nausea and vomiting.  Genitourinary: Negative for difficulty urinating and flank pain.  Neurological: Negative for dizziness and headaches.  Hematological: Does not bruise/bleed easily.  Psychiatric/Behavioral: Positive for sleep  disturbance.       Objective:   Physical Exam Vitals signs and nursing note reviewed.  Constitutional:      General: He is not in acute distress.    Appearance: He is not ill-appearing, toxic-appearing or diaphoretic.  HENT:     Head: Normocephalic and atraumatic.     Right Ear: Tympanic membrane, ear canal and external ear normal. There is no impacted cerumen.     Left Ear: Tympanic  membrane, ear canal and external ear normal. There is no impacted cerumen.     Nose: Congestion and rhinorrhea present.     Mouth/Throat:     Mouth: Mucous membranes are moist.     Pharynx: Posterior oropharyngeal erythema present.  Eyes:     Extraocular Movements: Extraocular movements intact.     Conjunctiva/sclera: Conjunctivae normal.     Pupils: Pupils are equal, round, and reactive to light.  Cardiovascular:     Rate and Rhythm: Normal rate.     Pulses: Normal pulses.     Heart sounds: Normal heart sounds. No murmur. No friction rub. No gallop.   Pulmonary:     Effort: Pulmonary effort is normal.     Breath sounds: Examination of the right-middle field reveals decreased breath sounds. Examination of the right-lower field reveals decreased breath sounds. Decreased breath sounds present. No wheezing, rhonchi or rales.  Lymphadenopathy:     Cervical: No cervical adenopathy.  Skin:    General: Skin is warm.     Capillary Refill: Capillary refill takes less than 2 seconds.  Neurological:     Mental Status: He is alert and oriented to person, place, and time.  Psychiatric:        Mood and Affect: Mood normal.        Behavior: Behavior normal.        Thought Content: Thought content normal.        Judgment: Judgment normal.       Assessment & Plan:   1. Bronchitis     Bronchitis [place] Controlled Substance Database reviewed- no aberrancies noted Please take Azithromycin as directed. Please take Tussionex and Flonase as needed. He has tolerated Tussionex well in past  Increase fluids/rest/vit c-2,000mg /when not feeling well. If symptoms persist after antibiotic completed, please call clinic.    FOLLOW-UP:  Return if symptoms worsen or fail to improve.

## 2018-07-31 NOTE — Assessment & Plan Note (Signed)
[place] Controlled Substance Database reviewed- no aberrancies noted Please take Azithromycin as directed. Please take Tussionex and Flonase as needed. He has tolerated Tussionex well in past  Increase fluids/rest/vit c-2,000mg /when not feeling well. If symptoms persist after antibiotic completed, please call clinic.

## 2018-08-14 DIAGNOSIS — L570 Actinic keratosis: Secondary | ICD-10-CM | POA: Diagnosis not present

## 2018-08-14 DIAGNOSIS — X32XXXA Exposure to sunlight, initial encounter: Secondary | ICD-10-CM | POA: Diagnosis not present

## 2019-02-12 DIAGNOSIS — D2261 Melanocytic nevi of right upper limb, including shoulder: Secondary | ICD-10-CM | POA: Diagnosis not present

## 2019-02-12 DIAGNOSIS — D225 Melanocytic nevi of trunk: Secondary | ICD-10-CM | POA: Diagnosis not present

## 2019-02-12 DIAGNOSIS — D2262 Melanocytic nevi of left upper limb, including shoulder: Secondary | ICD-10-CM | POA: Diagnosis not present

## 2019-02-12 DIAGNOSIS — X32XXXA Exposure to sunlight, initial encounter: Secondary | ICD-10-CM | POA: Diagnosis not present

## 2019-02-12 DIAGNOSIS — D485 Neoplasm of uncertain behavior of skin: Secondary | ICD-10-CM | POA: Diagnosis not present

## 2019-02-12 DIAGNOSIS — D0439 Carcinoma in situ of skin of other parts of face: Secondary | ICD-10-CM | POA: Diagnosis not present

## 2019-02-12 DIAGNOSIS — L570 Actinic keratosis: Secondary | ICD-10-CM | POA: Diagnosis not present

## 2019-02-12 DIAGNOSIS — L821 Other seborrheic keratosis: Secondary | ICD-10-CM | POA: Diagnosis not present

## 2019-04-29 DIAGNOSIS — D099 Carcinoma in situ, unspecified: Secondary | ICD-10-CM | POA: Diagnosis not present

## 2019-04-29 DIAGNOSIS — L814 Other melanin hyperpigmentation: Secondary | ICD-10-CM | POA: Diagnosis not present

## 2019-04-29 DIAGNOSIS — Z85828 Personal history of other malignant neoplasm of skin: Secondary | ICD-10-CM | POA: Diagnosis not present

## 2019-09-17 DIAGNOSIS — D2271 Melanocytic nevi of right lower limb, including hip: Secondary | ICD-10-CM | POA: Diagnosis not present

## 2019-09-17 DIAGNOSIS — D2261 Melanocytic nevi of right upper limb, including shoulder: Secondary | ICD-10-CM | POA: Diagnosis not present

## 2019-09-17 DIAGNOSIS — D2262 Melanocytic nevi of left upper limb, including shoulder: Secondary | ICD-10-CM | POA: Diagnosis not present

## 2019-09-17 DIAGNOSIS — Z85828 Personal history of other malignant neoplasm of skin: Secondary | ICD-10-CM | POA: Diagnosis not present

## 2019-12-11 ENCOUNTER — Other Ambulatory Visit: Payer: Self-pay

## 2019-12-11 ENCOUNTER — Ambulatory Visit
Admission: EM | Admit: 2019-12-11 | Discharge: 2019-12-11 | Disposition: A | Discharge status: Home / Self Care | Payer: BC Managed Care – PPO | Attending: Physician Assistant | Admitting: Physician Assistant

## 2019-12-11 DIAGNOSIS — J209 Acute bronchitis, unspecified: Secondary | ICD-10-CM

## 2019-12-11 MED ORDER — DOXYCYCLINE HYCLATE 100 MG PO TABS: 100.0000 mg | ORAL_TABLET | Freq: Two times a day (BID) | ORAL | 0 refills | Status: DC

## 2019-12-11 MED ORDER — HYDROCOD POLST-CPM POLST ER 10-8 MG/5ML PO SUER: 5.0000 mL | Freq: Two times a day (BID) | ORAL | 0 refills | Status: DC

## 2019-12-11 NOTE — ED Provider Notes (Signed)
[facility]    CSN: [id number] Arrival date & time: 12/11/19  0849      History   Chief Complaint Chief Complaint  Patient presents with  . Cough    HPI Leroy Salazar is a 59 y.o. male.   The history is provided by the patient. No language interpreter was used.  Cough Cough characteristics:  Non-productive Sputum characteristics:  Nondescript Severity:  Moderate Onset quality:  Gradual Timing:  Constant Progression:  Worsening Chronicity:  New Smoker: no   Relieved by:  Nothing Worsened by:  Nothing Ineffective treatments:  None tried Associated symptoms: no fever   Risk factors: no recent infection   Pt reports he gets bronchitis every year.  Pt states everyone in his famioy getsit.    Past Medical History:  Diagnosis Date  . Anxiety   . Depression     Patient Active Problem List   Diagnosis Date Noted  . Diarrhea of presumed infectious origin 10/17/2017  . Bronchitis 09/12/2016  . Otitis media of right ear 08/24/2016  . Conjunctivitis 08/24/2016  . Obesity 02/12/2016  . Acute gross stress reaction 02/12/2016  . Elevated blood pressure 02/12/2016  . Adjustment disorder with mixed anxiety and depressed mood 02/12/2016  . Right foot injury 10/25/2011  . Achilles tendinitis 10/25/2011    Past Surgical History:  Procedure Laterality Date  . FOOT SURGERY         Home Medications    Prior to Admission medications   Medication Sig Start Date End Date Taking? Authorizing Provider  chlorpheniramine-HYDROcodone (TUSSIONEX PENNKINETIC ER) 10-8 MG/5ML SUER Take 5 mLs by mouth 2 (two) times daily. 12/11/19   Elson Areas, PA-C  doxycycline (VIBRA-TABS) 100 MG tablet Take 1 tablet (100 mg total) by mouth 2 (two) times daily. 12/11/19   Elson Areas, PA-C  traZODone (DESYREL) 50 MG tablet Take 1 tablet (50 mg total) by mouth at bedtime. As needed for sleep 02/29/16   Little, Ambrose Finland, MD    Family History Family History  Problem Relation  Age of Onset  . Alcohol abuse Paternal Uncle   . Alcohol abuse Paternal Grandfather   . Sudden death Neg Hx   . Hyperlipidemia Neg Hx   . Heart attack Neg Hx   . Diabetes Neg Hx   . Hypertension Neg Hx     Social History Social History   Tobacco Use  . Smoking status: Never Smoker  . Smokeless tobacco: Former Neurosurgeon    Types: Chew  Substance Use Topics  . Alcohol use: Yes    Comment: socially  . Drug use: No     Allergies   Codeine   Review of Systems Review of Systems  Constitutional: Negative for fever.  Respiratory: Positive for cough.   All other systems reviewed and are negative.    Physical Exam Triage Vital Signs ED Triage Vitals [12/11/19 0904]  Enc Vitals Group     BP (!) 137/99     Pulse Rate 81     Resp 18     Temp (!) 97.5 F (36.4 C)     Temp Source Oral     SpO2 93 %     Weight      Height      Head Circumference      Peak Flow      Pain Score 0     Pain Loc      Pain Edu?      Excl. in GC?  No data found.  Updated Vital Signs BP (!) 137/99 (BP Location: Left Arm)   Pulse 81   Temp (!) 97.5 F (36.4 C) (Oral)   Resp 18   SpO2 93%   Visual Acuity Right Eye Distance:   Left Eye Distance:   Bilateral Distance:    Right Eye Near:   Left Eye Near:    Bilateral Near:     Physical Exam Vitals and nursing note reviewed.  Constitutional:      Appearance: He is well-developed.  HENT:     Head: Normocephalic and atraumatic.  Eyes:     Conjunctiva/sclera: Conjunctivae normal.  Cardiovascular:     Rate and Rhythm: Normal rate and regular rhythm.     Heart sounds: No murmur.  Pulmonary:     Effort: Pulmonary effort is normal. No respiratory distress.     Breath sounds: Normal breath sounds.  Abdominal:     Palpations: Abdomen is soft.     Tenderness: There is no abdominal tenderness.  Musculoskeletal:        General: Normal range of motion.     Cervical back: Neck supple.  Skin:    General: Skin is warm and dry.    Neurological:     General: No focal deficit present.     Mental Status: He is alert.  Psychiatric:        Mood and Affect: Mood normal.      UC Treatments / Results  Labs (all labs ordered are listed, but only abnormal results are displayed) Labs Reviewed - No data to display  EKG   Radiology No results found.  Procedures Procedures (including critical care time)  Medications Ordered in UC Medications - No data to display  Initial Impression / Assessment and Plan / UC Course  I have reviewed the triage vital signs and the nursing notes.  Pertinent labs & imaging results that were available during my care of the patient were reviewed by me and considered in my medical decision making (see chart for details).     MDM:  Pt given rx for doxcycycline and tussionex.   Final Clinical Impressions(s) / UC Diagnoses   Final diagnoses:  Acute bronchitis, unspecified organism     Discharge Instructions     See your Physician for recheck in 1 week if symptoms persist    ED Prescriptions    Medication Sig Dispense Auth. Provider   chlorpheniramine-HYDROcodone (TUSSIONEX PENNKINETIC ER) 10-8 MG/5ML SUER Take 5 mLs by mouth 2 (two) times daily. 70 mL [name], PA-C   doxycycline (VIBRA-TABS) 100 MG tablet Take 1 tablet (100 mg total) by mouth 2 (two) times daily. 20 tablet [name], [place]     PDMP not reviewed this encounter.   [name], [place] 12/11/19 [phone]

## 2019-12-11 NOTE — ED Triage Notes (Signed)
Pt c/o productive cough with yellow sputum and chest congestion since Friday after working in the yard. States hx of bronchitis once a year and this feels the same.

## 2019-12-11 NOTE — Discharge Instructions (Addendum)
See your Physician for recheck in 1 week if symptoms persist  °

## 2020-02-06 DIAGNOSIS — Z23 Encounter for immunization: Secondary | ICD-10-CM | POA: Diagnosis not present

## 2020-03-25 ENCOUNTER — Ambulatory Visit (INDEPENDENT_AMBULATORY_CARE_PROVIDER_SITE_OTHER): Payer: BC Managed Care – PPO | Admitting: Physician Assistant

## 2020-03-25 ENCOUNTER — Other Ambulatory Visit: Payer: Self-pay

## 2020-03-25 ENCOUNTER — Encounter: Payer: Self-pay | Admitting: Physician Assistant

## 2020-03-25 DIAGNOSIS — U071 COVID-19: Secondary | ICD-10-CM

## 2020-03-25 DIAGNOSIS — J40 Bronchitis, not specified as acute or chronic: Secondary | ICD-10-CM | POA: Diagnosis not present

## 2020-03-25 MED ORDER — PREDNISONE 20 MG PO TABS: ORAL_TABLET | ORAL | 0 refills | Status: DC

## 2020-03-25 MED ORDER — HYDROCOD POLST-CPM POLST ER 10-8 MG/5ML PO SUER: 5.0000 mL | Freq: Two times a day (BID) | ORAL | 0 refills | Status: DC

## 2020-03-25 NOTE — Progress Notes (Signed)
Telehealth office visit note for Leroy Masker, PA-C- at Primary Care at [facility]   I connected with current patient today by telephone and verified that I am speaking with the correct person    Location of the patient: Home  Location of the provider: Office - This visit type was conducted due to national recommendations for restrictions regarding the COVID-19 Pandemic (e.g. social distancing) in an effort to limit this patient's exposure and mitigate transmission in our community.    - No physical exam could be performed with this format, beyond that communicated to [place] by the patient/ family members as noted.   - Additionally my office staff/ schedulers were to discuss with the patient that there may be a monetary charge related to this service, depending on their medical insurance.  My understanding is that patient understood and consented to proceed.     _________________________________________________________________________________   History of Present Illness: Pt calls in to for symptoms of postnasal drainage, chest congestion and cough post-covid 19 infection. Pt reports he was exposed and started developing symptoms 03/10/20. Pt tested positive 03/13/20. States he doesn't feel terrible. Reports fatigue if he moves around. Cough worsens with talking. Denies dyspnea or fever. Pt has tried Mucinex at night. He is currently working from home.      GAD 7 : Generalized Anxiety Score 02/12/2016  Nervous, Anxious, on Edge 3  Control/stop worrying 3  Worry too much - different things 3  Trouble relaxing 3  Restless 3  Easily annoyed or irritable 3  Afraid - awful might happen 0  Total GAD 7 Score 18  Anxiety Difficulty Extremely difficult    Depression screen [facility] 2/9 03/25/2020 10/17/2017 02/12/2016  Decreased Interest 0 0 3  Down, Depressed, Hopeless 0 0 2  PHQ - 2 Score 0 0 5  Altered sleeping 0 1 3  Tired, decreased energy 0 1 0  Change in appetite 0 0 3  Feeling bad  or failure about yourself  0 0 3  Trouble concentrating 0 0 3  Moving slowly or fidgety/restless 0 0 3  Suicidal thoughts 0 0 2  PHQ-9 Score 0 2 22      Impression and Recommendations:     1. COVID-19 virus infection   2. Bronchitis     Covid-19 virus infection, Bronchitis: -Patient denies red flag signs or symptoms for severe infection and is aware of symptoms to monitor for. -Will send Tussionex for cough and prednisone taper for congestion symptoms.  -Continue home supportive therapy.  -Discussed with patient quarantine guidelines and recommend to research CDC for additional information.   - As part of my medical decision making, I reviewed the following data within the electronic MEDICAL RECORD NUMBER History obtained from pt /family, CMA notes reviewed and incorporated if applicable, Labs reviewed, Radiograph/ tests reviewed if applicable and OV notes from prior OV's with me, as well as any other specialists she/he has seen since seeing me last, were all reviewed and used in my medical decision making process today.    - Additionally, when appropriate, discussion had with patient regarding our treatment plan, and their biases/concerns about that plan were used in my medical decision making today.    - The patient agreed with the plan and demonstrated an understanding of the instructions.   No barriers to understanding were identified.     - The patient was advised to call back or seek an in-person evaluation if the symptoms worsen or if  the condition fails to improve as anticipated.   No follow-ups on file.    No orders of the defined types were placed in this encounter.   Meds ordered this encounter  Medications   chlorpheniramine-HYDROcodone (TUSSIONEX PENNKINETIC ER) 10-8 MG/5ML SUER    Sig: Take 5 mLs by mouth 2 (two) times daily.    Dispense:  70 mL    Refill:  0    Order Specific Question:   Supervising Provider    Answer:   Nani Gasser D [[id number]]    predniSONE (DELTASONE) 20 MG tablet    Sig: Take 3 tablets PO x 2 days, then 2 tablets x 2 days, then 1 tablet x 2 days, then 0.5 tablet x 2 days    Dispense:  15 tablet    Refill:  0    Order Specific Question:   Supervising Provider    Answer:   Nani Gasser D [[id number]]    Medications Discontinued During This Encounter  Medication Reason   chlorpheniramine-HYDROcodone (TUSSIONEX PENNKINETIC ER) 10-8 MG/5ML SUER Reorder       Time spent on visit including pre-visit chart review and post-visit care was 10 minutes.      The 21st Century Cures Act was signed into law in 2016 which includes the topic of electronic health records.  This provides immediate access to information in MyChart.  This includes consultation notes, operative notes, office notes, lab results and pathology reports.  If you have any questions about what you read please let us know at your next visit or call us at the office.  We are right here with you.  Note:  This note was prepared with assistance of Dragon voice recognition software. Occasional wrong-word or sound-a-like substitutions may have occurred due to the inherent limitations of voice recognition software.   __________________________________________________________________________________     Patient Care Team    Relationship Specialty Notifications Start End  Leroy Salazar, [place] PCP - General   11/10/19      -Vitals obtained; medications/ allergies reconciled;  personal medical, social, Sx etc.histories were updated by CMA, reviewed by me and are reflected in chart   Patient Active Problem List   Diagnosis Date Noted   Diarrhea of presumed infectious origin 10/17/2017   Bronchitis 09/12/2016   Otitis media of right ear 08/24/2016   Conjunctivitis 08/24/2016   Obesity 02/12/2016   Acute gross stress reaction 02/12/2016   Elevated blood pressure 02/12/2016   Adjustment disorder with mixed anxiety and depressed mood 02/12/2016    Right foot injury 10/25/2011   Achilles tendinitis 10/25/2011     Current Meds  Medication Sig   chlorpheniramine-HYDROcodone (TUSSIONEX PENNKINETIC ER) 10-8 MG/5ML SUER Take 5 mLs by mouth 2 (two) times daily.   traZODone (DESYREL) 50 MG tablet Take 1 tablet (50 mg total) by mouth at bedtime. As needed for sleep   [DISCONTINUED] chlorpheniramine-HYDROcodone (TUSSIONEX PENNKINETIC ER) 10-8 MG/5ML SUER Take 5 mLs by mouth 2 (two) times daily.     Allergies:  Allergies  Allergen Reactions   Codeine Hives     ROS:  See above HPI for pertinent positives and negatives   Objective:   There were no vitals taken for this visit.  (if some vitals are omitted, this means that patient was UNABLE to obtain them even though they were asked to get them prior to OV today.  They were asked to call us at their earliest convenience with these once obtained. ) General: A & O * 3; sounds in  no acute distress  Respiratory: speaking in full sentences, no conversational dyspnea;  Psych: insight appears good, mood- appears full

## 2020-05-01 DIAGNOSIS — Z20822 Contact with and (suspected) exposure to covid-19: Secondary | ICD-10-CM | POA: Diagnosis not present

## 2021-07-07 ENCOUNTER — Ambulatory Visit (INDEPENDENT_AMBULATORY_CARE_PROVIDER_SITE_OTHER): Payer: No Typology Code available for payment source | Admitting: Physician Assistant

## 2021-07-07 ENCOUNTER — Encounter: Payer: Self-pay | Admitting: Physician Assistant

## 2021-07-07 ENCOUNTER — Other Ambulatory Visit: Payer: Self-pay

## 2021-07-07 VITALS — BP 130/83 | HR 80 | Temp 97.9°F | Ht 74.0 in | Wt 282.0 lb

## 2021-07-07 DIAGNOSIS — Z13228 Encounter for screening for other metabolic disorders: Secondary | ICD-10-CM | POA: Diagnosis not present

## 2021-07-07 DIAGNOSIS — Z0000 Encounter for general adult medical examination without abnormal findings: Secondary | ICD-10-CM

## 2021-07-07 DIAGNOSIS — Z1321 Encounter for screening for nutritional disorder: Secondary | ICD-10-CM | POA: Diagnosis not present

## 2021-07-07 DIAGNOSIS — Z1329 Encounter for screening for other suspected endocrine disorder: Secondary | ICD-10-CM | POA: Diagnosis not present

## 2021-07-07 DIAGNOSIS — Z130 Encounter for screening for diseases of the blood and blood-forming organs and certain disorders involving the immune mechanism: Secondary | ICD-10-CM

## 2021-07-07 NOTE — Progress Notes (Signed)
Male physical   Impression and Recommendations:    1. Healthcare maintenance   2. Screening for endocrine, nutritional, metabolic and immunity disorder      1) Anticipatory Guidance: Skin CA prevention- recommend to use sunscreen when outside along with skin surveillance; eat a balanced and modest diet; physical activity at least 25 minutes per day or minimum of 150 min/ week moderate to intense activity.  2) Immunizations / Screenings / Labs:   All immunizations are up-to-date per recommendations or will be updated today if pt allows.    - Patient understands with dental and vision screens they will schedule independently.  - Will obtain CBC, CMP, HgA1c, Lipid panel, TSH, pt will schedule lab visit. - Pt deferred immunizations.  3) Weight: Recommend to improve diet habits to improve overall feelings of well being and objective health data. Improve nutrient density of diet through increasing intake of fruits and vegetables and decreasing saturated fats, white flour products and refined sugars.   4) Healthcare maintenance: -Recommend to stay well-hydrated. -Patient will schedule lab visit for fasting blood work. -Follow up in 1 year for CPE and FBW or sooner if needed.   Orders Placed This Encounter  Procedures   CBC with Differential/Platelet    Standing Status:   Future    Standing Expiration Date:   08/07/2021   Comprehensive metabolic panel    Standing Status:   Future    Standing Expiration Date:   08/07/2021    Order Specific Question:   Has the patient fasted?    Answer:   Yes   Hemoglobin A1c    Standing Status:   Future    Standing Expiration Date:   08/07/2021   Lipid panel    Standing Status:   Future    Standing Expiration Date:   08/07/2021    Order Specific Question:   Has the patient fasted?    Answer:   Yes   TSH    Standing Status:   Future    Standing Expiration Date:   08/07/2021    No orders of the defined types were placed in this  encounter.    Return in about 1 year (around 07/07/2022) for CPE and FBW; lab visit tomorrow for FBW.     Gross side effects, risk and benefits, and alternatives of medications discussed with patient.  Patient is aware that all medications have potential side effects and we are unable to predict every side effect or drug-drug interaction that may occur.  Expresses verbal understanding and consents to current therapy plan and treatment regimen.  Please see AVS handed out to patient at the end of our visit for further patient instructions/ counseling done pertaining to today's office visit.       Subjective:        CC: CPE   HPI: Rawlin Reaume Janeway is a 60 y.o. male who presents to [facility] today for a yearly health maintenance exam.     Health Maintenance Summary  - Reviewed and updated, unless pt declines services.  Last Cologuard or Colonoscopy:   05/28/2013-repeat in 10 years  Family history of Colon CA: No Tobacco History Reviewed:   Yes, never smoked.  Stopped chewing tobacco. Abdominal Ultrasound: N/A CT scan for screening lung CA: N/A Alcohol / drug use:    No concerns, no excessive use / no use Exercise Habits:   No routine exercise regimen Dermatology home: Yes  Male history: STD concerns:  none Additional penile/ urinary concerns:   None   Additional concerns beyond Health Maintenance issues:   None    Immunization History  Administered Date(s) Administered   Janssen (J&J) SARS-COV-2 Vaccination 02/06/2020     Health Maintenance  Topic Date Due   TETANUS/TDAP  Never done   Zoster Vaccines- Shingrix (1 of 2) Never done   COVID-19 Vaccine (2 - Booster for [employer] series) 04/02/2020   INFLUENZA VACCINE  Never done   COLONOSCOPY (Pts 45-75yrs Insurance coverage will need to be confirmed)  05/29/2023   Hepatitis C Screening  Completed   HIV Screening  Completed   Pneumococcal Vaccine 71-50 Years old  Aged Out   HPV  VACCINES  Aged Out       Wt Readings from Last 3 Encounters:  07/07/21 282 lb (127.9 kg)  07/31/18 288 lb 14.4 oz (131 kg)  10/17/17 278 lb 11.2 oz (126.4 kg)   BP Readings from Last 3 Encounters:  07/07/21 130/83  12/11/19 (!) 137/99  07/31/18 124/79   Pulse Readings from Last 3 Encounters:  07/07/21 80  12/11/19 81  07/31/18 88    Patient Active Problem List   Diagnosis Date Noted   Diarrhea of presumed infectious origin 10/17/2017   Bronchitis 09/12/2016   Otitis media of right ear 08/24/2016   Conjunctivitis 08/24/2016   Obesity 02/12/2016   Acute gross stress reaction 02/12/2016   Elevated blood pressure 02/12/2016   Adjustment disorder with mixed anxiety and depressed mood 02/12/2016   Right foot injury 10/25/2011   Achilles tendinitis 10/25/2011    Past Medical History:  Diagnosis Date   Anxiety    Depression     Past Surgical History:  Procedure Laterality Date   FOOT SURGERY      Family History  Problem Relation Age of Onset   Alcohol abuse Paternal Uncle    Alcohol abuse Paternal Grandfather    Sudden death Neg Hx    Hyperlipidemia Neg Hx    Heart attack Neg Hx    Diabetes Neg Hx    Hypertension Neg Hx     Social History   Substance and Sexual Activity  Drug Use No  ,  Social History   Substance and Sexual Activity  Alcohol Use Yes   Comment: socially  ,  Social History   Tobacco Use  Smoking Status Never  Smokeless Tobacco Former   Types: Sports administrator  ,  Social History   Substance and Sexual Activity  Sexual Activity Yes    Patient's Medications  New Prescriptions   No medications on file  Previous Medications   No medications on file  Modified Medications   No medications on file  Discontinued Medications   CHLORPHENIRAMINE-HYDROCODONE (TUSSIONEX PENNKINETIC ER) 10-8 MG/5ML SUER    Take 5 mLs by mouth 2 (two) times daily.   DOXYCYCLINE (VIBRA-TABS) 100 MG TABLET    Take 1 tablet (100 mg total) by mouth 2 (two) times daily.    PREDNISONE (DELTASONE) 20 MG TABLET    Take 3 tablets PO x 2 days, then 2 tablets x 2 days, then 1 tablet x 2 days, then 0.5 tablet x 2 days   TRAZODONE (DESYREL) 50 MG TABLET    Take 1 tablet (50 mg total) by mouth at bedtime. As needed for sleep    Codeine  Review of Systems: General:   Denies fever, chills, unexplained weight loss.  Optho/Auditory:   Denies visual changes, blurred vision/LOV Respiratory:   Denies SOB, DOE more than  baseline levels.   Cardiovascular:   Denies chest pain, palpitations, new onset peripheral edema  Gastrointestinal:   Denies nausea, vomiting, diarrhea.  Genitourinary: Denies dysuria, freq/ urgency, flank pain  Endocrine:     Denies hot or cold intolerance, polyuria, polydipsia. Musculoskeletal:   Denies unexplained myalgias, joint swelling, unexplained arthralgias, gait problems.  Skin:  Denies rash, suspicious lesions Neurological:     Denies dizziness, unexplained weakness, +numbness/tingling  Psychiatric/Behavioral:   Denies mood changes, suicidal or homicidal ideations, hallucinations    Objective:     Blood pressure 130/83, pulse 80, temperature 97.9 F (36.6 C), height 6\' 2"  (1.88 m), weight 282 lb (127.9 kg), SpO2 95 %. Body mass index is 36.21 kg/m. General Appearance:    Alert, cooperative, no distress, appears stated age  Head:    Normocephalic, without obvious abnormality, atraumatic  Eyes:    PERRL, conjunctiva/corneas clear, EOM's intact, fundi    benign, both eyes  Ears:    Normal TM's and external ear canals, both ears  Nose:   Nares normal, septum midline, mucosa normal, no drainage    or sinus tenderness  Throat:   Lips w/o lesion, mucosa moist, and tongue normal; teeth and gums normal  Neck:   Supple, symmetrical, trachea midline, no adenopathy;    thyroid:  no enlargement/tenderness/nodules; no JVD  Back:     Symmetric, no curvature, ROM normal, no CVA tenderness  Lungs:     Clear to auscultation bilaterally, respirations  unlabored, no Wh/ R/ R  Chest Wall:    No tenderness or gross deformity; normal excursion   Heart:    Regular rate and rhythm, S1 and S2 normal, no murmur, rub   or gallop  Abdomen:     Soft, non-tender, bowel sounds active all four quadrants, No G/R/R, no masses, no organomegaly  Genitalia:  Deferred  Rectal:  Deferred  Extremities:   Extremities normal, atraumatic, no cyanosis or gross edema  Pulses:   2+ and symmetric all extremities  Skin:   Warm, dry, turgor normal, +facial erythema (currently followed by derm)  M-Sk:   Ambulates * 4 w/o difficulty, no gross deformities, tone WNL  Neurologic:   CNII-XII grossly intact Psych:  No HI/SI, judgement and insight good, Euthymic mood. Full Affect.    ;le

## 2021-07-07 NOTE — Patient Instructions (Signed)

## 2021-07-08 ENCOUNTER — Other Ambulatory Visit: Payer: No Typology Code available for payment source

## 2021-07-08 DIAGNOSIS — Z13228 Encounter for screening for other metabolic disorders: Secondary | ICD-10-CM

## 2021-07-08 DIAGNOSIS — Z0000 Encounter for general adult medical examination without abnormal findings: Secondary | ICD-10-CM

## 2021-07-09 LAB — CBC WITH DIFFERENTIAL/PLATELET
Basophils Absolute: 0 10*3/uL (ref 0.0–0.2)
Basos: 1 %
EOS (ABSOLUTE): 0.2 10*3/uL (ref 0.0–0.4)
Eos: 3 %
Hematocrit: 51.8 % — ABNORMAL HIGH (ref 37.5–51.0)
Hemoglobin: 17.6 g/dL (ref 13.0–17.7)
Immature Grans (Abs): 0 10*3/uL (ref 0.0–0.1)
Immature Granulocytes: 0 %
Lymphocytes Absolute: 2.5 10*3/uL (ref 0.7–3.1)
Lymphs: 43 %
MCH: 30.9 pg (ref 26.6–33.0)
MCHC: 34 g/dL (ref 31.5–35.7)
MCV: 91 fL (ref 79–97)
Monocytes Absolute: 0.5 10*3/uL (ref 0.1–0.9)
Monocytes: 9 %
Neutrophils Absolute: 2.6 10*3/uL (ref 1.4–7.0)
Neutrophils: 44 %
Platelets: 246 10*3/uL (ref 150–450)
RBC: 5.69 x10E6/uL (ref 4.14–5.80)
RDW: 12.5 % (ref 11.6–15.4)
WBC: 5.7 10*3/uL (ref 3.4–10.8)

## 2021-07-09 LAB — COMPREHENSIVE METABOLIC PANEL
ALT: 23 IU/L (ref 0–44)
AST: 21 IU/L (ref 0–40)
Albumin/Globulin Ratio: 2.8 — ABNORMAL HIGH (ref 1.2–2.2)
Albumin: 4.7 g/dL (ref 3.8–4.9)
Alkaline Phosphatase: 45 IU/L (ref 44–121)
BUN/Creatinine Ratio: 20 (ref 9–20)
BUN: 19 mg/dL (ref 6–24)
Bilirubin Total: 0.6 mg/dL (ref 0.0–1.2)
CO2: 19 mmol/L — ABNORMAL LOW (ref 20–29)
Calcium: 9.6 mg/dL (ref 8.7–10.2)
Chloride: 105 mmol/L (ref 96–106)
Creatinine, Ser: 0.94 mg/dL (ref 0.76–1.27)
Globulin, Total: 1.7 g/dL (ref 1.5–4.5)
Glucose: 102 mg/dL — ABNORMAL HIGH (ref 70–99)
Potassium: 4.3 mmol/L (ref 3.5–5.2)
Sodium: 142 mmol/L (ref 134–144)
Total Protein: 6.4 g/dL (ref 6.0–8.5)
eGFR: 94 mL/min/{1.73_m2} (ref >59)

## 2021-07-09 LAB — LIPID PANEL
Chol/HDL Ratio: 5.3 ratio — ABNORMAL HIGH (ref 0.0–5.0)
Cholesterol, Total: 168 mg/dL (ref 100–199)
HDL: 32 mg/dL — ABNORMAL LOW (ref >39)
LDL Chol Calc (NIH): 117 mg/dL — ABNORMAL HIGH (ref 0–99)
Triglycerides: 102 mg/dL (ref 0–149)
VLDL Cholesterol Cal: 19 mg/dL (ref 5–40)

## 2021-07-09 LAB — TSH: TSH: 2.46 u[IU]/mL (ref 0.450–4.500)

## 2021-07-09 LAB — HEMOGLOBIN A1C
Est. average glucose Bld gHb Est-mCnc: 126 mg/dL
Hgb A1c MFr Bld: 6 % — ABNORMAL HIGH (ref 4.8–5.6)

## 2021-10-23 ENCOUNTER — Encounter: Payer: Self-pay | Admitting: Emergency Medicine

## 2021-10-23 ENCOUNTER — Ambulatory Visit
Admission: EM | Admit: 2021-10-23 | Discharge: 2021-10-23 | Disposition: A | Discharge status: Home / Self Care | Payer: PRIVATE HEALTH INSURANCE | Attending: Student | Admitting: Student

## 2021-10-23 DIAGNOSIS — J208 Acute bronchitis due to other specified organisms: Secondary | ICD-10-CM

## 2021-10-23 DIAGNOSIS — Z765 Malingerer [conscious simulation]: Secondary | ICD-10-CM | POA: Diagnosis not present

## 2021-10-23 DIAGNOSIS — R455 Hostility: Secondary | ICD-10-CM

## 2021-10-23 MED ORDER — DOXYCYCLINE HYCLATE 100 MG PO CAPS: 100.0000 mg | ORAL_CAPSULE | Freq: Two times a day (BID) | ORAL | 0 refills | Status: DC

## 2021-10-23 MED ORDER — PREDNISONE 10 MG (21) PO TBPK: ORAL_TABLET | Freq: Every day | ORAL | 0 refills | Status: DC

## 2021-10-23 MED ORDER — PROMETHAZINE-DM 6.25-15 MG/5ML PO SYRP: 5.0000 mL | ORAL_SOLUTION | Freq: Four times a day (QID) | ORAL | 0 refills | Status: DC | PRN

## 2021-10-23 MED ORDER — BENZONATATE 100 MG PO CAPS: 100.0000 mg | ORAL_CAPSULE | Freq: Three times a day (TID) | ORAL | 0 refills | Status: DC

## 2021-10-23 NOTE — Discharge Instructions (Addendum)
-  You have bronchitis. Bronchitis is an inflammation of the lining of your bronchial tubes, which carry air to and from your lungs. This typically occurs after a virus, like a cold virus. People who have bronchitis often cough up thickened mucus, which can be discolored. This isn't a bacterial infection, so you don't need antibiotics. We treat it with medications to help reduce inflammation and open up your lungs.  ?-Prednisone taper for cough/bronchitis. I recommend taking this in the morning as it could give you energy.  Avoid NSAIDs like ibuprofen and alleve while taking this medication as they can increase your risk of stomach upset and even GI bleeding when in combination with a steroid. You can continue tylenol (acetaminophen) up to 1000mg  3x daily. ?-Tessalon (Benzonatate) as needed for cough. Take one pill up to 3x daily (every 8 hours) ?-Promethazine DM cough syrup for congestion/cough. This could make you drowsy, so take at night before bed. ?-It is at the provider's discretion as to whether a narcotic is prescribed. You are welcome to complain to management, though this will not change my prescribing behavior. ?

## 2021-10-23 NOTE — ED Notes (Signed)
Pt came in for a cough and stated that he would like hydrocodone prescribed. Provider stated that she does not prescribed narcotics and patient did not take kind to being told no and displayed an aggressive and narcicisstic tone towards the provider. Provider explained what she could send in to the patients pharmacy and patient asked for her first and last name to complain. Pt left facility irritated.  ?

## 2021-10-23 NOTE — ED Notes (Signed)
Pt was very belligerent when he came out of room and asking for name of provider bc he wanted a specific cough medicine, which was a narcotic that he had kept from a previous illness. Provider told her name and explained that she does not feel that he needed that medication and was happy to give him something else. He was not happy and walked out. ?

## 2021-10-23 NOTE — ED Provider Notes (Addendum)
?[facility] ? ? ? ?CSN: [id number] ?Arrival date & time: 10/23/21  0945 ? ? ?  ? ?History   ?Chief Complaint ?Chief Complaint  ?Patient presents with  ? Cough  ? Headache  ? Diarrhea  ? Generalized Body Aches  ? Chills  ? ? ?HPI ?Leroy Salazar is a 61 y.o. male presenting with viral syndrome for 6 days.  History bronchitis, states that typically occurs every few years and requires hydrocodone tablets to resolve the symptoms.  Today describes a nonproductive hacking cough, diarrhea, malaise.  States that symptoms started while he was abroad, he then proceeded to take an 11-hour flight home which exacerbated the condition.  The cough is currently nonproductive, and there is no associated shortness of breath, chest pain, fever/chills, dizziness, weakness. ? ?HPI ? ?Past Medical History:  ?Diagnosis Date  ? Anxiety   ? Depression   ? ? ?Patient Active Problem List  ? Diagnosis Date Noted  ? Diarrhea of presumed infectious origin 10/17/2017  ? Bronchitis 09/12/2016  ? Otitis media of right ear 08/24/2016  ? Conjunctivitis 08/24/2016  ? Obesity 02/12/2016  ? Acute gross stress reaction 02/12/2016  ? Elevated blood pressure 02/12/2016  ? Adjustment disorder with mixed anxiety and depressed mood 02/12/2016  ? Right foot injury 10/25/2011  ? Achilles tendinitis 10/25/2011  ? ? ?Past Surgical History:  ?Procedure Laterality Date  ? FOOT SURGERY    ? ? ? ? ? ?Home Medications   ? ?Prior to Admission medications   ?Medication Sig Start Date End Date Taking? Authorizing Provider  ?albuterol (VENTOLIN HFA) 108 (90 Base) MCG/ACT inhaler Inhale 1-2 puffs into the lungs every 6 (six) hours as needed for wheezing or shortness of breath. 10/30/21   Crain, Whitney L, PA  ?chlorpheniramine-HYDROcodone (TUSSIONEX PENNKINETIC ER) 10-8 MG/5ML Take 5 mLs by mouth at bedtime as needed for cough. 10/30/21   Crain, Whitney L, PA  ?montelukast (SINGULAIR) 10 MG tablet Take 1 tablet (10 mg total) by mouth at bedtime. 10/30/21   Maretta Bees, PA  ? ? ?Family History ?Family History  ?Problem Relation Age of Onset  ? Alcohol abuse Paternal Uncle   ? Alcohol abuse Paternal Grandfather   ? Sudden death Neg Hx   ? Hyperlipidemia Neg Hx   ? Heart attack Neg Hx   ? Diabetes Neg Hx   ? Hypertension Neg Hx   ? ? ?Social History ?Social History  ? ?Tobacco Use  ? Smoking status: Never  ? Smokeless tobacco: Former  ?  Types: Chew  ?Substance Use Topics  ? Alcohol use: Yes  ?  Comment: socially  ? Drug use: No  ? ? ? ?Allergies   ?Codeine ? ? ?Review of Systems ?Review of Systems  ?Constitutional:  Negative for appetite change, chills and fever.  ?HENT:  Negative for congestion, ear pain, rhinorrhea, sinus pressure, sinus pain and sore throat.   ?Eyes:  Negative for redness and visual disturbance.  ?Respiratory:  Positive for cough. Negative for chest tightness, shortness of breath and wheezing.   ?Cardiovascular:  Negative for chest pain and palpitations.  ?Gastrointestinal:  Negative for abdominal pain, constipation, diarrhea, nausea and vomiting.  ?Genitourinary:  Negative for dysuria, frequency and urgency.  ?Musculoskeletal:  Negative for myalgias.  ?Neurological:  Negative for dizziness, weakness and headaches.  ?Psychiatric/Behavioral:  Negative for confusion.   ?All other systems reviewed and are negative. ? ? ?Physical Exam ?Triage Vital Signs ?ED Triage Vitals  ?Enc Vitals Group  ?  BP 10/23/21 1039 125/88  ?   Pulse Rate 10/23/21 1039 99  ?   Resp 10/23/21 1039 18  ?   Temp 10/23/21 1039 98.1 ?F (36.7 ?C)  ?   Temp src --   ?   SpO2 10/23/21 1039 94 %  ?   Weight --   ?   Height --   ?   Head Circumference --   ?   Peak Flow --   ?   Pain Score 10/23/21 1040 0  ?   Pain Loc --   ?   Pain Edu? --   ?   Excl. in GC? --   ? ?No data found. ? ?Updated Vital Signs ?BP 125/88   Pulse 99   Temp 98.1 ?F (36.7 ?C)   Resp 18   SpO2 94%  ? ?Visual Acuity ?Right Eye Distance:   ?Left Eye Distance:   ?Bilateral Distance:   ? ?Right Eye Near:   ?Left  Eye Near:    ?Bilateral Near:    ? ?Physical Exam ?Vitals reviewed.  ?Constitutional:   ?   General: He is not in acute distress. ?   Appearance: Normal appearance. He is not ill-appearing or diaphoretic.  ?HENT:  ?   Head: Normocephalic and atraumatic.  ?   Mouth/Throat:  ?   Pharynx: No posterior oropharyngeal erythema.  ?   Comments: Minimal erythema posterior pharynx with no tonsillar enlargement. Uvula midline. Normal phonation. ?Cardiovascular:  ?   Rate and Rhythm: Normal rate and regular rhythm.  ?   Heart sounds: Normal heart sounds.  ?Pulmonary:  ?   Effort: Pulmonary effort is normal.  ?   Breath sounds: Normal breath sounds. No decreased breath sounds, wheezing, rhonchi or rales.  ?   Comments: Patient resting comfortably in the waiting room and while walking to his room. Upon my entry into the room started hacking cough. ?Skin: ?   General: Skin is warm.  ?Neurological:  ?   General: No focal deficit present.  ?   Mental Status: He is alert and oriented to person, place, and time.  ?Psychiatric:     ?   Mood and Affect: Mood normal. Affect is angry.     ?   Behavior: Behavior normal.     ?   Thought Content: Thought content normal.     ?   Judgment: Judgment normal.  ? ? ? ?UC Treatments / Results  ?Labs ?(all labs ordered are listed, but only abnormal results are displayed) ?Labs Reviewed - No data to display ? ?EKG ? ? ?Radiology ?No results found. ? ?Procedures ?Procedures (including critical care time) ? ?Medications Ordered in UC ?Medications - No data to display ? ?Initial Impression / Assessment and Plan / UC Course  ?I have reviewed the triage vital signs and the nursing notes. ? ?Pertinent labs & imaging results that were available during my care of the patient were reviewed by me and considered in my medical decision making (see chart for details). ? ?  ? ?This patient is a 61 y.o. year old male presenting with viral bronchitis. Today this pt is afebrile nontachycardic nontachypneic,  oxygenating well on room air, no wheezes rhonchi or rales. Nontoxic appearing. ? ?Symptoms for 6 days, out of antiviral window.  Did not check a COVID or influenza PCR. ? ?Immediately upon my entry into the room, I introduced myself and clarified information from the triage note. The patient then stated, "It seems like the nurse  missed some things, because that's not everything that's going on." Patient pulled out a bottle of hycodan tablets, stating that this is what he requires today.  I then explained the etiology of his presumed viral bronchitis, and why I typically do not prescribe a narcotic for this.  Patient quickly became ADAMANT that he requires this, and soon became belligerent, stating that this is why he was here, and I am wasting his time and not being empathetic. ("I waited AN HOUR to be seen are you're not going to help me? This is the ONLY thing that works when I have a cough.")  I extensively explained the pathophysiology of bronchitis, and why this is better managed with prednisone, albuterol, Tessalon, etc. I kindly and calmly offered that as I had not yet examined him, and could not provide the service he was requesting, he was welcome to leave and receive a refund. At this point, he became verbally aggressive and hostile, insulting both my profession and intelligence, and so I calmly exited the room.  ? ?I returned to the room with Torrie, CMA.  Patient again emphasized his need for Hycodan with an aggressive tone. I stressed that he could not speak to me disrespectfully, to which he said, "oh, you haven't seen anger yet.". The patient did eventually allow me to examine his lungs at that time, and I sent prednisone, Tessalon, promethazine, doxycycline as below.  Though he does not currently have a pulmonary infection, he does have a history of this and he is welcome to start this antibiotic today or in 2 days if symptoms persist. ? ?After he was discharged, the patient then followed me to the  nursing station, where he continued to the berate me and insist that he had received poor care. He was again asked to leave.  I ultimately encouraged him to complain to management if he had further concerns.  In respons

## 2021-10-23 NOTE — ED Triage Notes (Signed)
Pt is present today with a productive cough, HA, chills, body aches,and diarrhea Pt sx started  last Thursday  ?

## 2021-10-30 ENCOUNTER — Ambulatory Visit: Payer: PRIVATE HEALTH INSURANCE

## 2021-10-30 ENCOUNTER — Ambulatory Visit (INDEPENDENT_AMBULATORY_CARE_PROVIDER_SITE_OTHER): Payer: PRIVATE HEALTH INSURANCE

## 2021-10-30 ENCOUNTER — Ambulatory Visit
Admission: EM | Admit: 2021-10-30 | Discharge: 2021-10-30 | Disposition: A | Discharge status: Home / Self Care | Payer: PRIVATE HEALTH INSURANCE | Attending: Urgent Care | Admitting: Urgent Care

## 2021-10-30 DIAGNOSIS — J209 Acute bronchitis, unspecified: Secondary | ICD-10-CM | POA: Diagnosis not present

## 2021-10-30 DIAGNOSIS — R059 Cough, unspecified: Secondary | ICD-10-CM | POA: Diagnosis not present

## 2021-10-30 MED ORDER — HYDROCOD POLI-CHLORPHE POLI ER 10-8 MG/5ML PO SUER: 5.0000 mL | Freq: Every evening | ORAL | 0 refills | Status: DC | PRN

## 2021-10-30 MED ORDER — ALBUTEROL SULFATE (2.5 MG/3ML) 0.083% IN NEBU
2.5000 mg | INHALATION_SOLUTION | Freq: Once | RESPIRATORY_TRACT | Status: AC
  Administered 2021-10-30: 2.5 mg via RESPIRATORY_TRACT

## 2021-10-30 MED ORDER — ALBUTEROL SULFATE HFA 108 (90 BASE) MCG/ACT IN AERS: 1.0000 | INHALATION_SPRAY | Freq: Four times a day (QID) | RESPIRATORY_TRACT | 0 refills | Status: DC | PRN

## 2021-10-30 MED ORDER — MONTELUKAST SODIUM 10 MG PO TABS: 10.0000 mg | ORAL_TABLET | Freq: Every day | ORAL | 0 refills | Status: DC

## 2021-10-30 NOTE — Discharge Instructions (Addendum)
Your cough is related to acute bronchitis.  These are usually viral or inflammatory in nature. Your chest xray is negative for acute findings. ?Please finish taking your prednisone. ?Start using the inhaler 2 puffs every 4-6 hours.  I would do this around-the-clock for the next 48 hours, then as needed. ?Start taking the montelukast 1 tablet every night. ?Use the cough suppressant only on an as-needed basis.  This can be sedating. ?Follow-up with PCP should symptoms persist. ?

## 2021-10-30 NOTE — ED Triage Notes (Signed)
Pt c/o cough that was evaluated last Saturday. States was given rx and has been taking them without relief. Also nasal drainage, headache, night sweats.  ? ?Denies sore throat, ear aches, nausea, vomiting, diarrhea, constipation, body aches, chills,  ? ?Onset ~ beginning of march. States has been traveling to multiple countries lately. Says he can feel a spot in his right lung that is different when he coughs.  ?

## 2021-10-30 NOTE — ED Provider Notes (Signed)
?[facility] ? ? ? ?CSN: [id number] ?Arrival date & time: 10/30/21  1329 ? ? ?  ? ?History   ?Chief Complaint ?Chief Complaint  ?Patient presents with  ? Cough  ? ? ?HPI ?Leroy Salazar is a 61 y.o. male.  ? ?Pleasant 61 year old male presents today with concerns of a persistent dry cough.  He started with symptoms in early March.  He travels for his work, and symptoms worsened after coming back from [place].  He denies any chronic or long-term medical issues.  He does not have asthma or COPD.  He does not smoke.  He was seen 1 week ago and given a prescription for prednisone, doxycycline, and cough syrup.  He states he has been taking all prescriptions as prescribed, but still has this deep harsh nagging cough.  He does feel tight in the chest.  He had something similar in 2021, states the only thing that actually stopped him from coughing was Tussionex.  He denies fever.  He denies any GI symptoms.  He states he feels "something in his lung on the top right".  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  Denies any additional URI symptoms. ? ? ?Cough ? ?Past Medical History:  ?Diagnosis Date  ? Anxiety   ? Depression   ? ? ?Patient Active Problem List  ? Diagnosis Date Noted  ? Diarrhea of presumed infectious origin 10/17/2017  ? Bronchitis 09/12/2016  ? Otitis media of right ear 08/24/2016  ? Conjunctivitis 08/24/2016  ? Obesity 02/12/2016  ? Acute gross stress reaction 02/12/2016  ? Elevated blood pressure 02/12/2016  ? Adjustment disorder with mixed anxiety and depressed mood 02/12/2016  ? Right foot injury 10/25/2011  ? Achilles tendinitis 10/25/2011  ? ? ?Past Surgical History:  ?Procedure Laterality Date  ? FOOT SURGERY    ? ? ? ? ? ?Home Medications   ? ?Prior to Admission medications   ?Medication Sig Start Date End Date Taking? Authorizing Provider  ?albuterol  (VENTOLIN HFA) 108 (90 Base) MCG/ACT inhaler Inhale 1-2 puffs into the lungs every 6 (six) hours as needed for wheezing or shortness of breath. 10/30/21  Yes Lathyn Griggs L, PA  ?chlorpheniramine-HYDROcodone (TUSSIONEX PENNKINETIC ER) 10-8 MG/5ML Take 5 mLs by mouth at bedtime as needed for cough. 10/30/21  Yes Ailsa Mireles L, PA  ?montelukast (SINGULAIR) 10 MG tablet Take 1 tablet (10 mg total) by mouth at bedtime. 10/30/21  Yes Zoei Amison L, PA  ? ? ?Family History ?Family History  ?Problem Relation Age of Onset  ? Alcohol abuse Paternal Uncle   ? Alcohol abuse Paternal Grandfather   ? Sudden death Neg Hx   ? Hyperlipidemia Neg Hx   ? Heart attack Neg Hx   ? Diabetes Neg Hx   ? Hypertension Neg Hx   ? ? ?Social History ?Social History  ? ?Tobacco Use  ? Smoking status: Never  ? Smokeless tobacco: Former  ?  Types: Chew  ?Substance Use Topics  ? Alcohol use: Yes  ?  Comment: socially  ? Drug use: No  ? ? ? ?Allergies   ?Codeine ? ? ?Review of Systems ?Review of Systems  ?Respiratory:  Positive for cough and chest tightness.   ?All other systems reviewed and are negative. ? ? ?Physical Exam ?Triage Vital Signs ?ED Triage Vitals  ?Enc  Vitals Group  ?   BP 10/30/21 1355 136/89  ?   Pulse Rate 10/30/21 1355 85  ?   Resp 10/30/21 1355 18  ?   Temp 10/30/21 1355 97.9 ?F (36.6 ?C)  ?   Temp Source 10/30/21 1355 Oral  ?   SpO2 10/30/21 1355 91 %  ?   Weight --   ?   Height --   ?   Head Circumference --   ?   Peak Flow --   ?   Pain Score 10/30/21 1356 0  ?   Pain Loc --   ?   Pain Edu? --   ?   Excl. in [place]? --   ? ?No data found. ? ?Updated Vital Signs ?BP 136/89 (BP Location: Left Arm)   Pulse 85   Temp 97.9 ?F (36.6 ?C) (Oral)   Resp 18   SpO2 91%  ? ?Visual Acuity ?Right Eye Distance:   ?Left Eye Distance:   ?Bilateral Distance:   ? ?Right Eye Near:   ?Left Eye Near:    ?Bilateral Near:    ? ?Physical Exam ?Vitals and nursing note reviewed.  ?Constitutional:   ?   General: He is not in acute distress. ?    Appearance: Normal appearance. He is obese. He is not ill-appearing, toxic-appearing or diaphoretic.  ?HENT:  ?   Head: Normocephalic and atraumatic.  ?   Right Ear: Tympanic membrane, ear canal and external ear normal. There is no impacted cerumen.  ?   Left Ear: Tympanic membrane, ear canal and external ear normal. There is no impacted cerumen.  ?   Nose: Nose normal. No congestion or rhinorrhea.  ?   Mouth/Throat:  ?   Mouth: Mucous membranes are moist.  ?   Pharynx: Oropharynx is clear. No oropharyngeal exudate or posterior oropharyngeal erythema.  ?Eyes:  ?   General:     ?   Right eye: No discharge.     ?   Left eye: No discharge.  ?   Extraocular Movements: Extraocular movements intact.  ?   Conjunctiva/sclera: Conjunctivae normal.  ?   Pupils: Pupils are equal, round, and reactive to light.  ?Cardiovascular:  ?   Rate and Rhythm: Normal rate and regular rhythm.  ?   Pulses: Normal pulses.  ?   Heart sounds: Normal heart sounds. No murmur heard. ?  No friction rub.  ?Pulmonary:  ?   Effort: Pulmonary effort is normal.  ?   Breath sounds: No stridor. No wheezing, rhonchi or rales.  ?   Comments: Decreased breath sounds to bilateral apices initially, resolved after administration of albuterol neb in office ?Chest:  ?   Chest wall: No tenderness.  ?Musculoskeletal:  ?   Cervical back: Normal range of motion and neck supple. No rigidity or tenderness.  ?   Right lower leg: No edema.  ?   Left lower leg: No edema.  ?Lymphadenopathy:  ?   Cervical: No cervical adenopathy.  ?Skin: ?   General: Skin is warm and dry.  ?   Capillary Refill: Capillary refill takes less than 2 seconds.  ?   Coloration: Skin is not jaundiced.  ?   Findings: No erythema or rash.  ?Neurological:  ?   General: No focal deficit present.  ?   Mental Status: He is alert and oriented to person, place, and time.  ?Psychiatric:     ?   Mood and Affect: Mood normal.     ?  Behavior: Behavior normal.  ? ? ? ?UC Treatments / Results  ?Labs ?(all  labs ordered are listed, but only abnormal results are displayed) ?Labs Reviewed - No data to display ? ?EKG ? ? ?Radiology ?DG Chest 2 View ? ?Result Date: 10/30/2021 ?CLINICAL DATA:  Cough for 1.5 months.  Recent travel out of country. EXAM: CHEST - 2 VIEW COMPARISON:  PA chest and left rib radiographs 10/10/2008 FINDINGS: Cardiac silhouette and mediastinal contours are within normal limits. The lungs are clear. No pleural effusion or pneumothorax. Mild multilevel degenerative disc changes of the thoracic spine. There are old healed posterolateral left fourth, fifth, and sixth rib fractures, new from 10/10/2008 IMPRESSION: No active cardiopulmonary disease. Old healed posterolateral left rib fractures as above, new from remote comparison 10/10/2008 study. Electronically Signed   By: Yvonne Kendall M.D.   On: 10/30/2021 14:23   ? ?Procedures ?Procedures (including critical care time) ? ?Medications Ordered in UC ?Medications  ?albuterol (PROVENTIL) (2.5 MG/3ML) 0.083% nebulizer solution 2.5 mg (2.5 mg Nebulization Given 10/30/21 1446)  ? ? ?Initial Impression / Assessment and Plan / UC Course  ?I have reviewed the triage vital signs and the nursing notes. ? ?Pertinent labs & imaging results that were available during my care of the patient were reviewed by me and considered in my medical decision making (see chart for details). ? ?Clinical Course as of 10/30/21 1516  ?Sat Oct 30, 2021  ?1429 O2 fluctuated between 93%-96% upon provider recheck [WC]  ?  ?Clinical Course User Index ?[WC] Geryl Councilman L, PA  ? ? ?Acute bronchitis -chest x-ray was negative for pneumonia.  Patient has already been treated with doxycycline.  Symptoms consistent with acute bronchitis.  Patient to continue and finish his p.o. prednisone.  Breath sounds and O2 sats improved after administration of a single albuterol nebulizer in office.  We will send patient home with handheld Ventolin, instructed on how to properly use.  Also add montelukast  nightly.  Tussionex called in, patient understands to use only at night as this is sedating.  Return to clinic for follow-up should symptoms persist.  Red flag signs/symptoms discussed. ? ?Final Clinical Impres

## 2023-09-14 ENCOUNTER — Telehealth: Payer: Self-pay | Admitting: Physician Assistant

## 2023-09-14 NOTE — Telephone Encounter (Signed)
 Copied from CRM [id number]. Topic: Appointments - Transfer of Care >> Sep 14, 2023 12:19 PM Sim Boast F wrote: Pt is requesting to transfer FROM: Leroy Salazar Pt is requesting to transfer TO: Leroy Salazar  Reason for requested transfer: Wants to come back to [place]  It is the responsibility of the team the patient would like to transfer to (Dr. Norma Fredrickson) to reach out to the patient if for any reason this transfer is not acceptable.

## 2023-11-02 ENCOUNTER — Ambulatory Visit: Admitting: General Practice

## 2023-11-02 ENCOUNTER — Encounter: Payer: Self-pay | Admitting: General Practice

## 2023-11-02 VITALS — BP 130/84 | HR 73 | Temp 97.7°F | Ht 74.0 in | Wt 290.0 lb

## 2023-11-02 DIAGNOSIS — Z0000 Encounter for general adult medical examination without abnormal findings: Secondary | ICD-10-CM | POA: Diagnosis not present

## 2023-11-02 DIAGNOSIS — Z23 Encounter for immunization: Secondary | ICD-10-CM

## 2023-11-02 DIAGNOSIS — Z6837 Body mass index (BMI) 37.0-37.9, adult: Secondary | ICD-10-CM

## 2023-11-02 DIAGNOSIS — Z125 Encounter for screening for malignant neoplasm of prostate: Secondary | ICD-10-CM

## 2023-11-02 DIAGNOSIS — Z1211 Encounter for screening for malignant neoplasm of colon: Secondary | ICD-10-CM

## 2023-11-02 DIAGNOSIS — E66812 Obesity, class 2: Secondary | ICD-10-CM

## 2023-11-02 DIAGNOSIS — Z7689 Persons encountering health services in other specified circumstances: Secondary | ICD-10-CM | POA: Insufficient documentation

## 2023-11-02 DIAGNOSIS — E6609 Other obesity due to excess calories: Secondary | ICD-10-CM | POA: Diagnosis not present

## 2023-11-02 LAB — CBC
HCT: 53.1 % — ABNORMAL HIGH (ref 39.0–52.0)
Hemoglobin: 17.9 g/dL — ABNORMAL HIGH (ref 13.0–17.0)
MCHC: 33.7 g/dL (ref 30.0–36.0)
MCV: 93.1 fl (ref 78.0–100.0)
Platelets: 219 10*3/uL (ref 150.0–400.0)
RBC: 5.71 Mil/uL (ref 4.22–5.81)
RDW: 13 % (ref 11.5–15.5)
WBC: 7.3 10*3/uL (ref 4.0–10.5)

## 2023-11-02 LAB — COMPREHENSIVE METABOLIC PANEL WITH GFR
ALT: 31 U/L (ref 0–53)
AST: 26 U/L (ref 0–37)
Albumin: 5 g/dL (ref 3.5–5.2)
Alkaline Phosphatase: 37 U/L — ABNORMAL LOW (ref 39–117)
BUN: 19 mg/dL (ref 6–23)
CO2: 26 meq/L (ref 19–32)
Calcium: 9.6 mg/dL (ref 8.4–10.5)
Chloride: 102 meq/L (ref 96–112)
Creatinine, Ser: 1.03 mg/dL (ref 0.40–1.50)
GFR: 78.96 mL/min (ref >60.00)
Glucose, Bld: 127 mg/dL — ABNORMAL HIGH (ref 70–99)
Potassium: 4.5 meq/L (ref 3.5–5.1)
Sodium: 137 meq/L (ref 135–145)
Total Bilirubin: 0.8 mg/dL (ref 0.2–1.2)
Total Protein: 7.1 g/dL (ref 6.0–8.3)

## 2023-11-02 LAB — HEMOGLOBIN A1C: Hgb A1c MFr Bld: 6.5 % (ref 4.6–6.5)

## 2023-11-02 LAB — LIPID PANEL
Cholesterol: 179 mg/dL (ref 0–200)
HDL: 38.2 mg/dL — ABNORMAL LOW (ref >39.00)
LDL Cholesterol: 115 mg/dL — ABNORMAL HIGH (ref 0–99)
NonHDL: 140.41
Total CHOL/HDL Ratio: 5
Triglycerides: 128 mg/dL (ref 0.0–149.0)
VLDL: 25.6 mg/dL (ref 0.0–40.0)

## 2023-11-02 LAB — PSA: PSA: 2.01 ng/mL (ref 0.10–4.00)

## 2023-11-02 LAB — TSH: TSH: 2.73 u[IU]/mL (ref 0.35–5.50)

## 2023-11-02 NOTE — Patient Instructions (Signed)
 Stop by the lab prior to leaving today. I will notify you of your results once received.   It was a pleasure to meet you today! Please don't hesitate to contact me with any questions. Welcome to [facility]!

## 2023-11-02 NOTE — Assessment & Plan Note (Signed)
 Immunizations tetanus - given today.; shingles due - declines today.  Colonoscopy past due orders placed. PSA due and pending.  Discussed the importance of a healthy diet and regular exercise in order for weight loss, and to reduce the risk of further co-morbidity.  Exam stable. Labs pending.  Follow up in 1 year for repeat physical.

## 2023-11-02 NOTE — Assessment & Plan Note (Signed)
 Discussed the importance of healthy diet and exercise to affect sustainable weight loss.

## 2023-11-02 NOTE — Addendum Note (Signed)
 Addended by: Jolanda Nation on: 11/02/2023 10:29 AM   Modules accepted: Level of Service

## 2023-11-02 NOTE — Assessment & Plan Note (Signed)
 EMR reviewed briefly.

## 2023-11-02 NOTE — Assessment & Plan Note (Signed)
 Slightly above goal.  Discussed DASH diet, limiting salt intake, monitoring diet and exercise.

## 2023-11-02 NOTE — Progress Notes (Addendum)
 New Patient Office Visit  Subjective    Patient ID: Leroy Salazar, male    DOB: [birth year]  Age: 63 y.o. MRN: [id number]  CC:  Chief Complaint  Patient presents with   Transitions Of Care   Annual Exam    HPI [name] is a 63 y.o. male presents to establish care, for complete physical and follow up of chronic conditions.  Previous physical/labs/pcp: Alveria Avena PA.  Self employed- owns wedding venue. Travels a lot.   Immunizations: -Tetanus: due -Shingles: due, declines today.  Diet: Fair diet. Recently started monitoring his diet.  Exercise: No regular exercise. [employer] but hasn't [facility].  Eye exam: Completed three years ago.  Dental exam: Completes annually.  Colonoscopy: ten years ago- [facility].   PSA: Due  Goes to dermatology atleast once a year; has had moles removed on nose. Does treatment for scalp as needed.    Outpatient Encounter Medications as of 11/02/2023  Medication Sig   [DISCONTINUED] albuterol  (VENTOLIN  HFA) 108 (90 Base) MCG/ACT inhaler Inhale 1-2 puffs into the lungs every 6 (six) hours as needed for wheezing or shortness of breath.   [DISCONTINUED] chlorpheniramine-HYDROcodone  (TUSSIONEX PENNKINETIC ER) 10-8 MG/5ML Take 5 mLs by mouth at bedtime as needed for cough.   [DISCONTINUED] montelukast  (SINGULAIR ) 10 MG tablet Take 1 tablet (10 mg total) by mouth at bedtime.   No facility-administered encounter medications on file as of 11/02/2023.    Past Medical History:  Diagnosis Date   Anxiety    Depression     Past Surgical History:  Procedure Laterality Date   FOOT SURGERY      Family History  Problem Relation Age of Onset   Alcohol abuse Paternal Uncle    Alcohol abuse Paternal Grandfather    Sudden death Neg Hx    Hyperlipidemia Neg Hx    Heart attack Neg Hx    Diabetes Neg Hx    Hypertension Neg Hx     Social History   Socioeconomic History   Marital status: Married    Spouse name: Not on file   Number of  children: Not on file   Years of education: Not on file   Highest education level: Not on file  Occupational History   Not on file  Tobacco Use   Smoking status: Never   Smokeless tobacco: Former    Types: Chew  Substance and Sexual Activity   Alcohol use: Yes    Comment: socially   Drug use: No   Sexual activity: Yes  Other Topics Concern   Not on file  Social History Narrative   Not on file   Social Drivers of Health   Financial Resource Strain: Not on file  Food Insecurity: Not on file  Transportation Needs: Not on file  Physical Activity: Not on file  Stress: Not on file  Social Connections: Not on file  Intimate Partner Violence: Not on file    Review of Systems  Constitutional:  Negative for chills, fever, malaise/fatigue and weight loss.  HENT:  Negative for congestion, ear discharge, ear pain, hearing loss, nosebleeds, sinus pain, sore throat and tinnitus.   Eyes:  Negative for blurred vision, double vision, pain, discharge and redness.  Respiratory:  Negative for cough, shortness of breath, wheezing and stridor.   Cardiovascular:  Negative for chest pain, palpitations and leg swelling.  Gastrointestinal:  Negative for abdominal pain, constipation, diarrhea, heartburn, nausea and vomiting.  Genitourinary:  Negative for dysuria, frequency and urgency.  Musculoskeletal:  Negative for myalgias.  Skin:  Negative for rash.  Neurological:  Negative for dizziness, tingling, seizures, weakness and headaches.  Psychiatric/Behavioral:  Negative for depression, substance abuse and suicidal ideas. The patient is not nervous/anxious.         Objective    BP 130/84 (BP Location: Left Arm, Patient Position: Sitting, Cuff Size: Normal)   Pulse 73   Temp 97.7 F (36.5 C) (Oral)   Ht 6\' 2"  (1.88 m)   Wt 290 lb (131.5 kg)   SpO2 96%   BMI 37.23 kg/m   Physical Exam Vitals and nursing note reviewed.  Constitutional:      Appearance: Normal appearance.  HENT:      Head: Normocephalic and atraumatic.     Right Ear: Tympanic membrane, ear canal and external ear normal.     Left Ear: Tympanic membrane, ear canal and external ear normal.     Nose: Nose normal.     Mouth/Throat:     Mouth: Mucous membranes are moist.     Pharynx: Oropharynx is clear.  Eyes:     Conjunctiva/sclera: Conjunctivae normal.     Pupils: Pupils are equal, round, and reactive to light.  Cardiovascular:     Rate and Rhythm: Normal rate and regular rhythm.     Pulses: Normal pulses.     Heart sounds: Normal heart sounds.  Pulmonary:     Effort: Pulmonary effort is normal.     Breath sounds: Normal breath sounds.  Abdominal:     General: Abdomen is flat. Bowel sounds are normal.     Palpations: Abdomen is soft.  Musculoskeletal:        General: Normal range of motion.     Cervical back: Normal range of motion.  Skin:    General: Skin is warm and dry.     Capillary Refill: Capillary refill takes less than 2 seconds.  Neurological:     General: No focal deficit present.     Mental Status: He is alert and oriented to person, place, and time. Mental status is at baseline.  Psychiatric:        Mood and Affect: Mood normal.        Behavior: Behavior normal.        Thought Content: Thought content normal.        Judgment: Judgment normal.          Assessment & Plan:  Encounter for screening and preventative care Assessment & Plan: Immunizations tetanus - given today.; shingles due - declines today.  Colonoscopy past due orders placed. PSA due and pending.  Discussed the importance of a healthy diet and regular exercise in order for weight loss, and to reduce the risk of further co-morbidity.  Exam stable. Labs pending.  Follow up in 1 year for repeat physical.  Orders: -     CBC -     Comprehensive metabolic panel with GFR -     Lipid panel -     TSH  Establishing care with new doctor, encounter for Assessment & Plan: EMR reviewed briefly.    Prostate  cancer screening -     PSA  Screening for colon cancer -     Ambulatory referral to Gastroenterology  Class 2 obesity due to excess calories with body mass index (BMI) of 37.0 to 37.9 in adult, unspecified whether serious comorbidity present Assessment & Plan: Discussed the importance of healthy diet and exercise to affect sustainable weight loss.    Orders: -  CBC -     Comprehensive metabolic panel with GFR -     Lipid panel -     TSH -     Hemoglobin A1c  Need for Tdap vaccination -     Tdap vaccine greater than or equal to 7yo IM    Return in about 1 year (around 11/01/2024) for physical.   Jolanda Nation, NP

## 2023-11-03 NOTE — Progress Notes (Signed)
 Noted.

## 2023-11-10 ENCOUNTER — Ambulatory Visit: Admitting: Primary Care

## 2023-12-07 ENCOUNTER — Telehealth (INDEPENDENT_AMBULATORY_CARE_PROVIDER_SITE_OTHER): Admitting: Family Medicine

## 2023-12-07 ENCOUNTER — Telehealth: Payer: Self-pay

## 2023-12-07 DIAGNOSIS — R0981 Nasal congestion: Secondary | ICD-10-CM

## 2023-12-07 MED ORDER — DOXYCYCLINE HYCLATE 100 MG PO TABS: 100.0000 mg | ORAL_TABLET | Freq: Two times a day (BID) | ORAL | 0 refills | Status: DC

## 2023-12-07 NOTE — Telephone Encounter (Signed)
 Called pt and he was very rude I schedule appt with [facility] location because they had a slot open for tomorrow but just found out the provider want be I'n tomorrow I'm going to let Amy handle it when she come from lunch

## 2023-12-07 NOTE — Telephone Encounter (Signed)
 Copied from CRM [id number]. Topic: Clinical - Medical Advice >> Dec 07, 2023  1:12 PM Leroy Salazar wrote: Reason for CRM: Patient called in stating he is leaving to go out of town this weekend and has been dealing with a sinus infection for 5 days and can't get rid of it. Terressa Fess first available appointment is on Tuesday and the patient will be gone before then. Patient is asking if she can send over medication in the mean time.

## 2023-12-07 NOTE — Patient Instructions (Signed)
 -I sent the medication(s) we discussed to your pharmacy: Meds ordered this encounter  Medications   doxycycline  (VIBRA -TABS) 100 MG tablet    Sig: Take 1 tablet (100 mg total) by mouth 2 (two) times daily.    Dispense:  14 tablet    Refill:  0     I hope you are feeling better soon!  Seek in person care promptly if your symptoms worsen, new concerns arise or you are not improving with treatment.  It was nice to meet you today. I help [facility] out with telemedicine visits on Tuesdays and Thursdays and am happy to help if you need a virtual follow up visit on those days. Otherwise, if you have any concerns or questions following this visit please schedule a follow up visit with your Primary Care office or seek care at a local urgent care clinic to avoid delays in care. If you are having severe or life threatening symptoms please call 911 and/or go to the nearest emergency room.    Doxycycline  Capsules or Tablets What is this medication? DOXYCYCLINE  (dox i SYE kleen) treats infections caused by bacteria. It belongs to a group of medications called tetracycline antibiotics. It will not treat colds, the flu, or infections caused by viruses. This medicine may be used for other purposes; ask your health care provider or pharmacist if you have questions. COMMON BRAND NAME(S): Acticlate , Adoxa, Adoxa CK, Adoxa Pak, Adoxa TT, Alodox, Avidoxy, Doxal, LYMEPAK , Mondoxyne NL, Monodox , Morgidox 1x, Morgidox 1x Kit, Morgidox 2x, Morgidox 2x Kit, NutriDox, Ocudox, Okebo, Periostat , TARGADOX , Vibra -Tabs, Vibramycin  What should I tell my care team before I take this medication? They need to know if you have any of these conditions: Kidney disease Liver disease Long exposure to sunlight like working outdoors Recent stomach surgery Stomach or intestine problems, such as colitis Vision problems Yeast or fungal infection of the mouth or vagina An unusual or allergic reaction to doxycycline , other  medications, foods, dyes, or preservatives Pregnant or trying to get pregnant Breastfeeding How should I use this medication? Take this medication by mouth with water. Take it as directed on the prescription label at the same time every day. It is best to take this medication without food, but if it upsets your stomach take it with food. Take all of this medication unless your care team tells you to stop it early. Keep taking it even if you think you are better. Take antacids and products with aluminum, calcium, magnesium, iron, and zinc in them at a different time of day than this medication. Talk to your care team if you have questions. Talk to your care team about the use of this medication in children. While it may be prescribed for children for selected conditions, precautions do apply. Overdosage: If you think you have taken too much of this medicine contact a poison control center or emergency room at once. NOTE: This medicine is only for you. Do not share this medicine with others. What if I miss a dose? If you miss a dose, take it as soon as you can. If it is almost time for your next dose, take only that dose. Do not take double or extra doses. What may interact with this medication? Antacids, vitamins, or other products that contain aluminum, calcium, iron, magnesium, or zinc Barbiturates Bismuth subsalicylate Carbamazepine Estrogen or progestin hormones Methoxyflurane Oral retinoids, such as acitretin, isotretinoin Other antibiotics Phenytoin Warfarin This list may not describe all possible interactions. Give your health care provider a list  of all the medicines, herbs, non-prescription drugs, or dietary supplements you use. Also tell them if you smoke, drink alcohol, or use illegal drugs. Some items may interact with your medicine. What should I watch for while using this medication? Tell your care team if your symptoms do not improve. Do not treat diarrhea with over the counter  products. Contact your care team if you have diarrhea that lasts more than 2 days or if it is severe and watery. Do not take this medication just before going to bed. It may not dissolve properly when you lay down and can cause pain in your throat. Drink plenty of fluids while taking this medication to also help reduce irritation in your throat. This medication can make you more sensitive to the sun. Keep out of the sun. If you cannot avoid being in the sun, wear protective clothing and sunscreen. Do not use sun lamps, tanning beds, or tanning booths. Estrogen and progestin hormones may not work as well while you are taking this medication. A barrier contraceptive, such as a condom or diaphragm, is recommended if you are using these hormones for contraception. Talk to your care team about effective forms of contraception. If you are being treated for a sexually transmitted infection (STI), avoid sexual contact until you have finished your treatment. Your sexual partner may also need treatment. If you are using this medication to prevent malaria, you should still protect yourself from contact with mosquitos. Stay in screened-in areas, use mosquito nets, keep your body covered, and use an insect repellent. What side effects may I notice from receiving this medication? Side effects that you should report to your care team as soon as possible: Allergic reactions--skin rash, itching, hives, swelling of the face, lips, tongue, or throat Increased pressure around the brain--severe headache, change in vision, blurry vision, nausea, vomiting Joint pain Pain or trouble swallowing Redness, blistering, peeling, or loosening of the skin, including inside the mouth Severe diarrhea, fever Unusual vaginal discharge, itching, or odor Side effects that usually do not require medical attention (report these to your care team if they continue or are bothersome): Change in tooth  color Diarrhea Headache Heartburn Nausea This list may not describe all possible side effects. Call your doctor for medical advice about side effects. You may report side effects to FDA at 1-800-FDA-1088. Where should I keep my medication? Keep out of the reach of children and pets. Store at room temperature, below 30 degrees C (86 degrees F). Protect from light. Keep container tightly closed. Throw away any unused medication after the expiration date. Taking this medication after the expiration date can make you seriously ill. NOTE: This sheet is a summary. It may not cover all possible information. If you have questions about this medicine, talk to your doctor, pharmacist, or health care provider.  2024 Elsevier/Gold Standard (2022-03-30 00:00:00)

## 2023-12-07 NOTE — Telephone Encounter (Signed)
 Leroy Salazar is out of the office until Monday and medication can not be given without an evaluation. Patient needs an appt with any open provider.

## 2023-12-07 NOTE — Progress Notes (Signed)
 Virtual Visit via Video Note  I connected with Leroy Salazar  on 12/07/23 at  6:20 PM EDT by a video enabled telemedicine application and verified that I am speaking with the correct person using two identifiers.  Location patient: [place] Location provider:work or home office Persons participating in the virtual visit: patient, provider  I discussed the limitations and requested verbal permission for telemedicine visit. The patient expressed understanding and agreed to proceed.   HPI:  Acute telemedicine visit for "sinus infection" : -Onset: a week or more ago -Symptoms include: nasal congestion, cough, was doing ok... but now started worsening with development of sinus discomfort - L maxillary and some greenish tint to the mucus of the last few days -Denies: fevers, severe ha, CP, SOB, NVD -has not check covid test -Has tried: ibuprofen -Pertinent past medical history: see below -Pertinent medication allergies: Allergies  Allergen Reactions   Codeine Hives   -COVID-19 vaccine status:  Immunization History  Administered Date(s) Administered   Janssen (J&J) SARS-COV-2 Vaccination 02/06/2020   Tdap 11/02/2023     ROS: See pertinent positives and negatives per HPI.  Past Medical History:  Diagnosis Date   Anxiety    Depression     Past Surgical History:  Procedure Laterality Date   FOOT SURGERY       Current Outpatient Medications:    doxycycline  (VIBRA -TABS) 100 MG tablet, Take 1 tablet (100 mg total) by mouth 2 (two) times daily., Disp: 14 tablet, Rfl: 0  EXAM:  VITALS per patient if applicable:  GENERAL: alert, oriented, appears well and in no acute distress  HEENT: atraumatic, conjunttiva clear, no obvious abnormalities on inspection of external nose and ears  NECK: normal movements of the head and neck  LUNGS: on inspection no signs of respiratory distress, breathing rate appears normal, no obvious gross SOB, gasping or wheezing  CV: no obvious cyanosis  MS: moves  all visible extremities without noticeable abnormality  PSYCH/NEURO: pleasant and cooperative, no obvious depression or anxiety, speech and thought processing grossly intact  ASSESSMENT AND PLAN:  Discussed the following assessment and plan:  Nasal sinus congestion  -we discussed possible serious and likely etiologies, options for evaluation and workup,treatment, treatment risks and precautions. Pt is agreeable to treatment via telemedicine at this moment. Query developing sinusitis vs other. Discussed covid and flu testing, but is out of window for treatment. He wishes to start empiric treatment for bacterial sinusitis. Discussed risks, use, alternatives, options. Also discussed nasal saline rinse and short course nasal decongestant prior to flying.  Advised to seek prompt in person care if worsening, new symptoms arise, or if is not improving with treatment as expected per our conversation of expected course.   I discussed the assessment and treatment plan with the patient. The patient was provided an opportunity to ask questions and all were answered. The patient agreed with the plan and demonstrated an understanding of the instructions.     Maurie Southern, DO

## 2023-12-08 ENCOUNTER — Ambulatory Visit: Admitting: Nurse Practitioner

## 2023-12-08 NOTE — Telephone Encounter (Signed)
 Patient was treated by Carry Clapper, DO yesterday with doxycycline .

## 2024-01-31 ENCOUNTER — Ambulatory Visit
Admission: EM | Admit: 2024-01-31 | Discharge: 2024-01-31 | Disposition: A | Discharge status: Home / Self Care | Attending: Physician Assistant | Admitting: Physician Assistant

## 2024-01-31 DIAGNOSIS — H6993 Unspecified Eustachian tube disorder, bilateral: Secondary | ICD-10-CM

## 2024-01-31 MED ORDER — FLUTICASONE PROPIONATE 50 MCG/ACT NA SUSP: 1.0000 | Freq: Every day | NASAL | 0 refills | Status: AC

## 2024-01-31 MED ORDER — PREDNISONE 20 MG PO TABS: 40.0000 mg | ORAL_TABLET | Freq: Every day | ORAL | 0 refills | Status: AC

## 2024-01-31 NOTE — ED Triage Notes (Signed)
 I have a ear problem (right ear hurting) with lymph nodes swollen under it down to my neck, a lot of swimming recently, I will also be flying soon.

## 2024-01-31 NOTE — ED Provider Notes (Signed)
 [facility]    CSN: [id number] Arrival date & time: 01/31/24  0847      History   Chief Complaint Chief Complaint  Patient presents with   Otalgia    HPI Leroy Salazar is a 63 y.o. male.   Patient here today for evaluation of right ear discomfort that extends down to his neck area at times.  He reports some drainage in his throat which is causing some cough.  He does note that he has been doing a lot of swimming recently and is concerned because he will be flying soon.  He has not had any fever.  He has had some similar symptoms with his left ear but none at this time.  The history is provided by the patient.  Otalgia Associated symptoms: cough and sore throat   Associated symptoms: no abdominal pain, no congestion, no fever and no vomiting     Past Medical History:  Diagnosis Date   Anxiety    Depression     Patient Active Problem List   Diagnosis Date Noted   Establishing care with new doctor, encounter for 11/02/2023   Encounter for screening and preventative care 11/02/2023   Diarrhea of presumed infectious origin 10/17/2017   Bronchitis 09/12/2016   Otitis media of right ear 08/24/2016   Conjunctivitis 08/24/2016   Obesity 02/12/2016   Acute gross stress reaction 02/12/2016   Elevated blood pressure reading 02/12/2016   Adjustment disorder with mixed anxiety and depressed mood 02/12/2016   Right foot injury 10/25/2011   Achilles tendinitis 10/25/2011    Past Surgical History:  Procedure Laterality Date   FOOT SURGERY         Home Medications    Prior to Admission medications   Medication Sig Start Date End Date Taking? Authorizing Provider  fluticasone  (FLONASE ) 50 MCG/ACT nasal spray Place 1 spray into both nostrils daily. 01/31/24  Yes Billy Asberry FALCON, PA-C  predniSONE  (DELTASONE ) 20 MG tablet Take 2 tablets (40 mg total) by mouth daily with breakfast for 5 days. 01/31/24 02/05/24 Yes Billy Asberry FALCON, PA-C    Family History Family  History  Problem Relation Age of Onset   Alcohol abuse Paternal Grandfather    Alcohol abuse Paternal Uncle    Sudden death Neg Hx    Hyperlipidemia Neg Hx    Heart attack Neg Hx    Diabetes Neg Hx    Hypertension Neg Hx     Social History Social History   Tobacco Use   Smoking status: Never   Smokeless tobacco: Former    Types: Associate Professor status: Never Used  Substance Use Topics   Alcohol use: Not Currently    Comment: socially   Drug use: No     Allergies   Codeine   Review of Systems Review of Systems  Constitutional:  Negative for chills and fever.  HENT:  Positive for ear pain and sore throat. Negative for congestion.   Eyes:  Negative for discharge and redness.  Respiratory:  Positive for cough. Negative for shortness of breath.   Gastrointestinal:  Negative for abdominal pain, nausea and vomiting.  Skin:  Positive for color change and wound.  Neurological:  Negative for numbness.     Physical Exam Triage Vital Signs ED Triage Vitals  Encounter Vitals Group     BP 01/31/24 0918 126/86     Girls Systolic BP Percentile --      Girls Diastolic BP Percentile --  Boys Systolic BP Percentile --      Boys Diastolic BP Percentile --      Pulse Rate 01/31/24 0918 80     Resp 01/31/24 0918 18     Temp 01/31/24 0918 98 F (36.7 C)     Temp Source 01/31/24 0918 Oral     SpO2 01/31/24 0918 95 %     Weight 01/31/24 0915 289 lb 14.5 oz (131.5 kg)     Height 01/31/24 0915 6' 2 (1.88 m)     Head Circumference --      Peak Flow --      Pain Score 01/31/24 0912 3     Pain Loc --      Pain Education --      Exclude from Growth Chart --    No data found.  Updated Vital Signs BP 126/86 (BP Location: Left Arm)   Pulse 80   Temp 98 F (36.7 C) (Oral)   Resp 18   Ht 6' 2 (1.88 m)   Wt 289 lb 14.5 oz (131.5 kg)   SpO2 95%   BMI 37.22 kg/m   Visual Acuity Right Eye Distance:   Left Eye Distance:   Bilateral Distance:    Right Eye  Near:   Left Eye Near:    Bilateral Near:     Physical Exam Vitals and nursing note reviewed.  Constitutional:      General: He is not in acute distress.    Appearance: Normal appearance. He is not ill-appearing.  HENT:     Head: Normocephalic and atraumatic.     Right Ear: Ear canal and external ear normal. There is no impacted cerumen.     Left Ear: Ear canal and external ear normal. There is no impacted cerumen.     Ears:     Comments: Bilateral TMs dull nonerythematous, middle ear effusion noted to right    Nose: Nose normal. No congestion or rhinorrhea.     Mouth/Throat:     Mouth: Mucous membranes are moist.     Pharynx: Oropharynx is clear. No oropharyngeal exudate or posterior oropharyngeal erythema.  Eyes:     Conjunctiva/sclera: Conjunctivae normal.  Cardiovascular:     Rate and Rhythm: Normal rate.  Pulmonary:     Effort: Pulmonary effort is normal. No respiratory distress.  Neurological:     Mental Status: He is alert.  Psychiatric:        Mood and Affect: Mood normal.        Behavior: Behavior normal.        Thought Content: Thought content normal.      UC Treatments / Results  Labs (all labs ordered are listed, but only abnormal results are displayed) Labs Reviewed - No data to display  EKG   Radiology No results found.  Procedures Procedures (including critical care time)  Medications Ordered in UC Medications - No data to display  Initial Impression / Assessment and Plan / UC Course  I have reviewed the triage vital signs and the nursing notes.  Pertinent labs & imaging results that were available during my care of the patient were reviewed by me and considered in my medical decision making (see chart for details).    Suspect likely eustachian tube dysfunction and will treat with Flonase  and steroid burst.  Advised follow-up if no gradual improvement or with any further concerns.  Final Clinical Impressions(s) / UC Diagnoses   Final  diagnoses:  ETD (Eustachian tube dysfunction), bilateral  Discharge Instructions   None    ED Prescriptions     Medication Sig Dispense Auth. Provider   fluticasone  (FLONASE ) 50 MCG/ACT nasal spray Place 1 spray into both nostrils daily. 15.8 mL Billy Stabs F, PA-C   predniSONE  (DELTASONE ) 20 MG tablet Take 2 tablets (40 mg total) by mouth daily with breakfast for 5 days. 10 tablet Billy Stabs FALCON, PA-C      PDMP not reviewed this encounter.   Billy Stabs FALCON, PA-C 01/31/24 1235

## 2024-02-14 ENCOUNTER — Ambulatory Visit: Admitting: General Practice
